# Patient Record
Sex: Male | Born: 1960 | Race: White | Hispanic: No | Marital: Married | Smoking: Never smoker
Health system: Southern US, Community
[De-identification: ages and names within clinical notes are randomized; demographics above are authoritative.]

## PROBLEM LIST (undated history)

## (undated) DIAGNOSIS — R350 Frequency of micturition: Secondary | ICD-10-CM

## (undated) HISTORY — PX: VASECTOMY REVERSAL: SHX243

## (undated) HISTORY — PX: VASECTOMY: SHX75

---

## 2019-04-22 DIAGNOSIS — U071 COVID-19: Secondary | ICD-10-CM

## 2019-04-23 ENCOUNTER — Encounter (HOSPITAL_COMMUNITY): Payer: Self-pay | Admitting: Internal Medicine

## 2019-04-23 ENCOUNTER — Other Ambulatory Visit: Payer: Self-pay

## 2019-04-23 ENCOUNTER — Inpatient Hospital Stay (HOSPITAL_COMMUNITY)
Admission: AD | Admit: 2019-04-23 | Discharge: 2019-04-27 | DRG: 177 | Disposition: A | Discharge status: Home / Self Care | Payer: BC Managed Care – PPO | Source: Other Acute Inpatient Hospital | Attending: Family Medicine | Admitting: Family Medicine

## 2019-04-23 DIAGNOSIS — J1282 Pneumonia due to coronavirus disease 2019: Secondary | ICD-10-CM | POA: Diagnosis present

## 2019-04-23 DIAGNOSIS — J9601 Acute respiratory failure with hypoxia: Secondary | ICD-10-CM | POA: Diagnosis present

## 2019-04-23 DIAGNOSIS — E041 Nontoxic single thyroid nodule: Secondary | ICD-10-CM | POA: Diagnosis present

## 2019-04-23 DIAGNOSIS — R001 Bradycardia, unspecified: Secondary | ICD-10-CM | POA: Diagnosis present

## 2019-04-23 DIAGNOSIS — U071 COVID-19: Secondary | ICD-10-CM | POA: Diagnosis present

## 2019-04-23 DIAGNOSIS — R7989 Other specified abnormal findings of blood chemistry: Secondary | ICD-10-CM | POA: Diagnosis not present

## 2019-04-23 DIAGNOSIS — R0902 Hypoxemia: Secondary | ICD-10-CM

## 2019-04-23 HISTORY — DX: Frequency of micturition: R35.0

## 2019-04-23 LAB — CBC
HCT: 40.9 % (ref 39.0–52.0)
Hemoglobin: 13.9 g/dL (ref 13.0–17.0)
MCH: 31 pg (ref 26.0–34.0)
MCHC: 34 g/dL (ref 30.0–36.0)
MCV: 91.1 fL (ref 80.0–100.0)
Platelets: 266 10*3/uL (ref 150–400)
RBC: 4.49 MIL/uL (ref 4.22–5.81)
RDW: 13 % (ref 11.5–15.5)
WBC: 7.7 10*3/uL (ref 4.0–10.5)
nRBC: 0 % (ref 0.0–0.2)

## 2019-04-23 LAB — HIV ANTIBODY (ROUTINE TESTING W REFLEX): HIV Screen 4th Generation wRfx: NONREACTIVE

## 2019-04-23 LAB — CREATININE, SERUM
Creatinine, Ser: 0.86 mg/dL (ref 0.61–1.24)
GFR calc Af Amer: >60 mL/min (ref >60)
GFR calc non Af Amer: >60 mL/min (ref >60)

## 2019-04-23 LAB — HEMOGLOBIN A1C
Hgb A1c MFr Bld: 5.4 % (ref 4.8–5.6)
Mean Plasma Glucose: 108.28 mg/dL

## 2019-04-23 LAB — GLUCOSE, CAPILLARY
Glucose-Capillary: 119 mg/dL — ABNORMAL HIGH (ref 70–99)
Glucose-Capillary: 138 mg/dL — ABNORMAL HIGH (ref 70–99)

## 2019-04-23 LAB — ABO/RH: ABO/RH(D): A POS

## 2019-04-23 MED ORDER — SODIUM CHLORIDE 0.9 % IV SOLN
100.0000 mg | Freq: Every day | INTRAVENOUS | Status: AC
  Administered 2019-04-23 – 2019-04-26 (×4): 100 mg via INTRAVENOUS
  Filled 2019-04-23 (×5): qty 20

## 2019-04-23 MED ORDER — INSULIN ASPART 100 UNIT/ML ~~LOC~~ SOLN: 0.0000 [IU] | Freq: Every day | SUBCUTANEOUS | Status: DC

## 2019-04-23 MED ORDER — ZINC SULFATE 220 (50 ZN) MG PO CAPS
220.0000 mg | ORAL_CAPSULE | Freq: Every day | ORAL | Status: DC
  Administered 2019-04-23 – 2019-04-27 (×5): 220 mg via ORAL
  Filled 2019-04-23 (×5): qty 1

## 2019-04-23 MED ORDER — ENOXAPARIN SODIUM 40 MG/0.4ML ~~LOC~~ SOLN
40.0000 mg | SUBCUTANEOUS | Status: DC
  Administered 2019-04-23 – 2019-04-27 (×4): 40 mg via SUBCUTANEOUS
  Filled 2019-04-23 (×4): qty 0.4

## 2019-04-23 MED ORDER — DEXAMETHASONE SODIUM PHOSPHATE 10 MG/ML IJ SOLN
6.0000 mg | INTRAMUSCULAR | Status: DC
  Administered 2019-04-24: 6 mg via INTRAVENOUS
  Filled 2019-04-23: qty 1

## 2019-04-23 MED ORDER — IPRATROPIUM-ALBUTEROL 20-100 MCG/ACT IN AERS
1.0000 | INHALATION_SPRAY | Freq: Four times a day (QID) | RESPIRATORY_TRACT | Status: DC
  Administered 2019-04-23 – 2019-04-27 (×14): 1 via RESPIRATORY_TRACT
  Filled 2019-04-23: qty 4

## 2019-04-23 MED ORDER — PANTOPRAZOLE SODIUM 40 MG PO TBEC
40.0000 mg | DELAYED_RELEASE_TABLET | Freq: Every day | ORAL | Status: DC
  Administered 2019-04-23 – 2019-04-27 (×5): 40 mg via ORAL
  Filled 2019-04-23 (×5): qty 1

## 2019-04-23 MED ORDER — ONDANSETRON HCL 4 MG/2ML IJ SOLN: 4.0000 mg | Freq: Four times a day (QID) | INTRAMUSCULAR | Status: DC | PRN

## 2019-04-23 MED ORDER — INSULIN ASPART 100 UNIT/ML ~~LOC~~ SOLN: 0.0000 [IU] | Freq: Three times a day (TID) | SUBCUTANEOUS | Status: DC

## 2019-04-23 MED ORDER — ASCORBIC ACID 500 MG PO TABS
500.0000 mg | ORAL_TABLET | Freq: Every day | ORAL | Status: DC
  Administered 2019-04-23 – 2019-04-27 (×5): 500 mg via ORAL
  Filled 2019-04-23 (×5): qty 1

## 2019-04-23 MED ORDER — ACETAMINOPHEN 325 MG PO TABS: 650.0000 mg | ORAL_TABLET | Freq: Four times a day (QID) | ORAL | Status: DC | PRN

## 2019-04-23 MED ORDER — ONDANSETRON HCL 4 MG PO TABS: 4.0000 mg | ORAL_TABLET | Freq: Four times a day (QID) | ORAL | Status: DC | PRN

## 2019-04-23 NOTE — H&P (Addendum)
History and Physical    Greydon Betke  ACZ:[id number]  DOB: [birth year]  DOA: 04/23/2019 PCP: Lise Auer, MD   Patient coming from: [facility]- from home  Chief Complaint: COVID 19 pneumonia  HPI: Parchment Shellhammer is a 59 y.o. male with medical history of recent infection with COVID-19 who was admitted to [facility] on 2/22.  Pulse ox was initially 80% on room air and CRP greater than 150.  Chest x-ray revealed patchy bilateral basilar pneumonia.  A CTA was negative for PE but revealed bilateral groundglass opacities and a basilar predominant distribution consistent with COVID-19 pneumonia.  The patient was initially started on 2 L of oxygen.  He was also given IV Decadron, Remdesivir and Actemra. He was initially given Ceftriaxone and Azithromycin as well but these were discontinued.  The patient's oxygen requirements have increased and he was increased steadily to 6 L of nasal cannula.  It was then decided that the patient be transferred to [facility].  Upon arrival, I have evaluated him. He has a mild cough with very little sputum. He has no dyspnea, nausea, vomiting or diarrhea. His appetite is good and he is urinating well. He has no discomfort currently.     Review of Systems:  All other systems reviewed and apart from HPI, are negative.  Past medical history  He has no medical issues  Social History:  He does not use tobacco, alcohol or drugs. He works as a IT sales professional in [employer]   Surgical history- Vasectomy with reversal of vasectomy  Family history: HTN   Prior to Admission medications   Not on File    Physical Exam: Wt Readings from Last 3 Encounters:  04/23/19 83.4 kg   Vitals:   04/23/19 1430 04/23/19 1457 04/23/19 1458  BP: 116/73    Pulse: 62    Temp: 98.2 F (36.8 C) 98.2 F (36.8 C)   TempSrc: Oral Oral   SpO2: 91%    Weight:   83.4 kg  Height:   5\' 11"  (1.803 m)      Constitutional:  Calm & comfortable Eyes: PERRLA, lids  and conjunctivae normal ENT:  Mucous membranes are moist.  Pharynx clear of exudate   Normal dentition.  Neck: Supple, no masses  Respiratory:  Crackles a b/l bases of lungs Normal respiratory effort.  Cardiovascular:  S1 & S2 heard, regular rate and rhythm No Murmurs Abdomen:  Non distended No tenderness, No masses Bowel sounds normal Extremities:  No clubbing / cyanosis No pedal edema No joint deformity    Skin:  No rashes, lesions or ulcers Neurologic:  AAO x 3 CN 2-12 grossly intact Sensation intact Strength 5/5 in all 4 extremities Psychiatric:  Normal Mood and affect    Labs on Admission: I have personally reviewed labs and imaging studies from [place]    Assessment/Plan Principal Problem:   Pneumonia due to COVID-19 virus   Acute respiratory failure with hypoxemia (HCC) - He tested positive for COVID 19 on 2/14- he believes he contracted it at work - he is currently on 6 L O2, has b/l crackles at lung bases but no respiratory distress - no GI symptoms - cont Decadron and Remdesivir - add Combivent and Protonix - cont incentive spirometry, Vit C and Zinc - daily labs ordered  Left thyroid nodule  - incidental finding on CT - outpatient work up   DVT prophylaxis: Lovenox Code Status: Full code  Family Communication: Melody, wife  Disposition Plan: home once improved  Consults called: none  Admission status: inpatient    Debbe Odea MD [facility] Pager: [email or web address] Password [email or web address] 7PM-7AM, please contact night-coverage   04/23/2019, 3:15 PM

## 2019-04-23 NOTE — Progress Notes (Signed)
Patient arrived from [facility] via stretcher. Patient A/Ox4, independant, SpO2 91% 5L nasal cannula. Patient has no complaints at this time.   Assessment and admission completed. MD rounded on patient and orders placed. Plan of care discussed with patient. IS/flutter valve bedside and patient educated on use. Patient requesting cheeseburger;cafeteria to bring pt one for dinner.   Call light in reach. Will continue to monitor.  1525: Patient ambulate to bathroom; steady gait. Back to bed.

## 2019-04-23 NOTE — Plan of Care (Signed)
  Problem: Respiratory: Goal: Will maintain a patent airway Outcome: Progressing   Problem: Education: Goal: Knowledge of risk factors and measures for prevention of condition will improve Outcome: Progressing   

## 2019-04-24 DIAGNOSIS — U071 COVID-19: Principal | ICD-10-CM

## 2019-04-24 DIAGNOSIS — J1282 Pneumonia due to coronavirus disease 2019: Secondary | ICD-10-CM

## 2019-04-24 LAB — CBC WITH DIFFERENTIAL/PLATELET
Abs Immature Granulocytes: 0.06 10*3/uL (ref 0.00–0.07)
Basophils Absolute: 0 10*3/uL (ref 0.0–0.1)
Basophils Relative: 0 %
Eosinophils Absolute: 0 10*3/uL (ref 0.0–0.5)
Eosinophils Relative: 0 %
HCT: 40.7 % (ref 39.0–52.0)
Hemoglobin: 13.4 g/dL (ref 13.0–17.0)
Immature Granulocytes: 1 %
Lymphocytes Relative: 7 %
Lymphs Abs: 0.6 10*3/uL — ABNORMAL LOW (ref 0.7–4.0)
MCH: 30.2 pg (ref 26.0–34.0)
MCHC: 32.9 g/dL (ref 30.0–36.0)
MCV: 91.7 fL (ref 80.0–100.0)
Monocytes Absolute: 0.6 10*3/uL (ref 0.1–1.0)
Monocytes Relative: 7 %
Neutro Abs: 7.5 10*3/uL (ref 1.7–7.7)
Neutrophils Relative %: 85 %
Platelets: 309 10*3/uL (ref 150–400)
RBC: 4.44 MIL/uL (ref 4.22–5.81)
RDW: 13 % (ref 11.5–15.5)
WBC: 8.9 10*3/uL (ref 4.0–10.5)
nRBC: 0 % (ref 0.0–0.2)

## 2019-04-24 LAB — COMPREHENSIVE METABOLIC PANEL
ALT: 39 U/L (ref 0–44)
AST: 41 U/L (ref 15–41)
Albumin: 2.7 g/dL — ABNORMAL LOW (ref 3.5–5.0)
Alkaline Phosphatase: 43 U/L (ref 38–126)
Anion gap: 7 (ref 5–15)
BUN: 28 mg/dL — ABNORMAL HIGH (ref 6–20)
CO2: 26 mmol/L (ref 22–32)
Calcium: 8.3 mg/dL — ABNORMAL LOW (ref 8.9–10.3)
Chloride: 108 mmol/L (ref 98–111)
Creatinine, Ser: 0.88 mg/dL (ref 0.61–1.24)
GFR calc Af Amer: >60 mL/min (ref >60)
GFR calc non Af Amer: >60 mL/min (ref >60)
Glucose, Bld: 99 mg/dL (ref 70–99)
Potassium: 4.2 mmol/L (ref 3.5–5.1)
Sodium: 141 mmol/L (ref 135–145)
Total Bilirubin: 0.5 mg/dL (ref 0.3–1.2)
Total Protein: 6.1 g/dL — ABNORMAL LOW (ref 6.5–8.1)

## 2019-04-24 LAB — GLUCOSE, CAPILLARY
Glucose-Capillary: 97 mg/dL (ref 70–99)
Glucose-Capillary: 137 mg/dL — ABNORMAL HIGH (ref 70–99)
Glucose-Capillary: 136 mg/dL — ABNORMAL HIGH (ref 70–99)

## 2019-04-24 LAB — D-DIMER, QUANTITATIVE: D-Dimer, Quant: 0.8 ug/mL-FEU — ABNORMAL HIGH (ref 0.00–0.50)

## 2019-04-24 LAB — C-REACTIVE PROTEIN: CRP: 5.9 mg/dL — ABNORMAL HIGH (ref <1.0)

## 2019-04-24 LAB — FERRITIN: Ferritin: 864 ng/mL — ABNORMAL HIGH (ref 24–336)

## 2019-04-24 LAB — MAGNESIUM: Magnesium: 2.2 mg/dL (ref 1.7–2.4)

## 2019-04-24 MED ORDER — LIP MEDEX EX OINT
TOPICAL_OINTMENT | CUTANEOUS | Status: DC | PRN
  Filled 2019-04-24: qty 7

## 2019-04-24 MED ORDER — METHYLPREDNISOLONE SODIUM SUCC 40 MG IJ SOLR
40.0000 mg | Freq: Two times a day (BID) | INTRAMUSCULAR | Status: DC
  Administered 2019-04-24 – 2019-04-27 (×7): 40 mg via INTRAVENOUS
  Filled 2019-04-24 (×7): qty 1

## 2019-04-24 NOTE — Progress Notes (Signed)
PROGRESS NOTE    Thoren Hosang  UOH:[id number] DOB: [birth year] DOA: 04/23/2019 PCP: Mateo Flow, MD   Brief Narrative:  Daniel Jensen is [place] 59 y.o. male with medical history of recent infection with COVID-19 who was admitted to [facility] on 2/22.  Pulse ox was initially 80% on room air and CRP greater than 150.  Chest x-ray revealed patchy bilateral basilar pneumonia.  Amnah Breuer CTA was negative for PE but revealed bilateral groundglass opacities and Frantz Quattrone basilar predominant distribution consistent with COVID-19 pneumonia.  The patient was initially started on 2 L of oxygen.  He was also given IV Decadron, Remdesivir and Actemra. He was initially given Ceftriaxone and Azithromycin as well but these were discontinued.  The patient's oxygen requirements have increased and he was increased steadily to 6 L of nasal cannula.  It was then decided that the patient be transferred to [facility].   Assessment & Plan:   Principal Problem:   Pneumonia due to COVID-19 virus Active Problems:   Acute respiratory failure with hypoxemia (HCC)   Thyroid nodule  Pneumonia due to COVID-19 virus   Acute respiratory failure with hypoxemia ([place]) - He tested positive for COVID 19 on 2/14- he believes he contracted it at work - currently requiring 4 L [place], improving - cont steroids and Remdesivir - s/p actemra at [place]  - add Combivent and Protonix - cont incentive spirometry, Vit C and Zinc - daily labs ordered - I/O, daily weights - IS, OOB, prone as able  COVID-19 Labs  Recent Labs    04/24/19 0310  DDIMER 0.80*  FERRITIN 864*  CRP 5.9*    No results found for: SARSCOV2NAA   Left thyroid nodule  - incidental finding on CT - outpatient work up with thyroid US recommended  Sinus Bradycardia Asymptomatic, continue to monitor  DVT prophylaxis: lovenox Code Status: full Family Communication: none at bedside Disposition Plan:  . Patient came from: home           . Anticipated d/c  place: home . Barriers to d/c OR conditions which need to be met to effect [place] safe d/c: pending further improvement in oxygenation  Consultants:   none  Procedures:   OSH CT Scan IMPRESSION: 1. No pulmonary embolus. 2. Geographic bilateral ground-glass opacities in Carlissa Pesola basilar predominant distribution consistent with COVID-19 pneumonia. Parenchymal involvement is moderate to extensive. 3. Left thyroid nodule measuring 19 mm. Recommend thyroid US (ref: J Am Coll Radiol. 2015 Feb;12(2): 143-50). 4. Prominent right hilar node is likely reactive.   Electronically Signed   By: [name] M.D.   On: 04/22/2019 19:02  Antimicrobials:  Anti-infectives (From admission, onward)   Start     Dose/Rate Route Frequency Ordered Stop   04/23/19 1800  remdesivir 100 mg in sodium chloride 0.9 % 100 mL IVPB     100 mg 200 mL/hr over 30 Minutes Intravenous Daily 04/23/19 1625 04/27/19 0959     Subjective: No new complaints Had hard time sleeping last night  Objective: Vitals:   04/23/19 1954 04/24/19 0440 04/24/19 0441 04/24/19 0759  BP: (!) 103/59 116/67  115/77  Pulse: 65 (!) 49 (!) 50 (!) 51  Resp:  15 13 17   Temp: (!) 97.2 F (36.2 C) 98.1 F (36.7 C)  98 F (36.7 C)  TempSrc: Oral Oral  Oral  SpO2: 94% 95% 96% 93%  Weight:      Height:        Intake/Output Summary (Last 24 hours) at  04/24/2019 1050 Last data filed at 04/24/2019 0300 Gross per 24 hour  Intake 340 ml  Output 300 ml  Net 40 ml   Filed Weights   04/23/19 1458  Weight: 83.4 kg    Examination:  General exam: Appears calm and comfortable  Respiratory system: Clear to auscultation. Respiratory effort normal. Cardiovascular system: S1 & S2 heard, RRR. Gastrointestinal system: Abdomen is nondistended, soft and nontender. Central nervous system: Alert and oriented. No focal neurological deficits. Extremities: no LEE Skin: No rashes, lesions or ulcers Psychiatry: Judgement and insight appear normal.  Mood & affect appropriate.     Data Reviewed: I have personally reviewed following labs and imaging studies  CBC: Recent Labs  Lab 04/23/19 1630 04/24/19 0310  WBC 7.7 8.9  NEUTROABS  --  7.5  HGB 13.9 13.4  HCT 40.9 40.7  MCV 91.1 91.7  PLT 266 161   Basic Metabolic Panel: Recent Labs  Lab 04/23/19 1630 04/24/19 0310  NA  --  141  K  --  4.2  CL  --  108  CO2  --  26  GLUCOSE  --  99  BUN  --  28*  CREATININE 0.86 0.88  CALCIUM  --  8.3*  MG  --  2.2   GFR: Estimated Creatinine Clearance: 97.5 mL/min (by C-G formula based on SCr of 0.88 mg/dL). Liver Function Tests: Recent Labs  Lab 04/24/19 0310  AST 41  ALT 39  ALKPHOS 43  BILITOT 0.5  PROT 6.1*  ALBUMIN 2.7*   No results for input(s): LIPASE, AMYLASE in the last 168 hours. No results for input(s): AMMONIA in the last 168 hours. Coagulation Profile: No results for input(s): INR, PROTIME in the last 168 hours. Cardiac Enzymes: No results for input(s): CKTOTAL, CKMB, CKMBINDEX, TROPONINI in the last 168 hours. BNP (last 3 results) No results for input(s): PROBNP in the last 8760 hours. HbA1C: Recent Labs    04/23/19 1630  HGBA1C 5.4   CBG: Recent Labs  Lab 04/23/19 1633 04/23/19 2002 04/24/19 0756  GLUCAP 119* 138* 97   Lipid Profile: No results for input(s): CHOL, HDL, LDLCALC, TRIG, CHOLHDL, LDLDIRECT in the last 72 hours. Thyroid Function Tests: No results for input(s): TSH, T4TOTAL, FREET4, T3FREE, THYROIDAB in the last 72 hours. Anemia Panel: Recent Labs    04/24/19 0310  FERRITIN 864*   Sepsis Labs: No results for input(s): PROCALCITON, LATICACIDVEN in the last 168 hours.  No results found for this or any previous visit (from the past 240 hour(s)).       Radiology Studies: No results found.      Scheduled Meds: . vitamin C  500 mg Oral Daily  . enoxaparin (LOVENOX) injection  40 mg Subcutaneous Q24H  . insulin aspart  0-5 Units Subcutaneous QHS  . insulin aspart   0-9 Units Subcutaneous TID WC  . Ipratropium-Albuterol  1 puff Inhalation QID  . methylPREDNISolone (SOLU-MEDROL) injection  40 mg Intravenous Q12H  . pantoprazole  40 mg Oral Daily  . zinc sulfate  220 mg Oral Daily   Continuous Infusions: . remdesivir 100 mg in NS 100 mL 100 mg (04/24/19 0920)     LOS: 1 day    Time spent: over 30 min    Fayrene Helper, MD [facility]   To contact the attending provider between 7A-7P or the covering provider during after hours 7P-7A, please log into the web site [email or web address] and access using universal [place] password for that web site. If you  do not have the password, please call the hospital operator.  04/24/2019, 10:50 AM

## 2019-04-24 NOTE — Progress Notes (Signed)
PHYSICAL THERAPY EVALUATION  HISTORY OF CURRENT ILLNESS:  CLINICAL IMPRESSION: Pt admitted with above hx and above dx, pt was very independent prior to admission and living home with family. This pm pt is still quite independent and moving very well, at rest he was on 5L/min and sats in low 90s, with ambulation, which he ambulates very fast pt desat to 80% on 6L/min via [place]. With cues able to recover to 91% within of rest with pursed lip breathing. Pt has been educated on importance of breathing exercises and also being mindful of breathing when ambulating. Pt will benefit from continued PT tx while in hospital to address deficits in activity tolerance. At d/c he should be able to return home w/o further follow up.  D/C RECOMMENDATION:  home with no f/u     04/24/19 1300  PT Visit Information  Assistance Needed +1  History of Present Illness 59 y.o. male with medical history of recent infection with COVID-19 who was admitted to [facility] on 2/22.  Pulse ox was initially 80% on room air and CRP greater than 150. He has a mild cough with very little sputum. He has no dyspnea, nausea, vomiting or diarrhea.   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  Communication  Communication No difficulties  Pain Assessment  Pain Assessment No/denies pain  Cognition  Arousal/Alertness Awake/alert  Behavior During Therapy WFL for tasks  assessed/performed  Overall Cognitive Status Within Functional Limits for tasks assessed  Upper Extremity Assessment  Upper Extremity Assessment Generalized weakness  Bed Mobility  Overal bed mobility Needs Assistance  General bed mobility comments Up in chair - Likely mod I with bed mob  Transfers  Overall transfer level Needs assistance  Equipment used None  Transfers Sit to/from Stand  Sit to Stand Supervision  Balance  Overall balance assessment No apparent balance deficits (not formally assessed)  Exercises  Exercises Other exercises  Other Exercises  Other Exercises x5 incentive spirometer  Other Exercises x5 flutter valve  Other Exercises pursed lip breathing  Other Exercises walk 400 ft  Acute Rehab PT Goals  Patient Stated Goal Go back to family   Drema Pry, PT

## 2019-04-24 NOTE — Progress Notes (Signed)
Occupational Therapy Evaluation Patient Details Name: Daniel Jensen MRN: [id number] DOB: [birth year] Today's Date: 04/24/2019    History of Present Illness 59 y.o. male with medical history of recent infection with COVID-19 who was admitted to [facility] on 2/22.  Pulse ox was initially 80% on room air and CRP greater than 150. He has a mild cough with very little sputum. He has no dyspnea, nausea, vomiting or diarrhea.    Clinical Impression   Patient is kind and lives at home with wife and children.  He is independent at prior level and is very active, walking a lot at his job.  Today patient was on 5L O2 at rest and SpO2 at 95.  With mobility on 6L SpO2 88, but no symptoms.  He was able to walk the unit twice with supervision and complete standing grooming and toileting.  Patient would benefit from OT to increase activity tolerance and energy conservation skills to improve independence with ADLs, as tolerance is not baseline and he is still requiring increased amount of O2.  Reviewed breathing exercises, able to pull 2500.  Will continue to follow with OT acutely to address the deficits listed below.      Follow Up Recommendations  No OT follow up    Equipment Recommendations  None recommended by OT    Recommendations for Other Services       Precautions / Restrictions Precautions Precautions: Fall Restrictions Weight Bearing Restrictions: No      Mobility Bed Mobility Overal bed mobility: Needs Assistance             General bed mobility comments: Up in chair - Likely mod I with bed mob  Transfers Overall transfer level: Needs assistance Equipment used: None Transfers: Sit to/from Stand Sit to Stand: Supervision              Balance Overall balance assessment: No apparent balance deficits (not formally assessed)                                         ADL either performed or assessed with clinical judgement   ADL Overall ADL's :  Needs assistance/impaired Eating/Feeding: Independent;Sitting   Grooming: Supervision/safety;Standing;Wash/dry hands;Oral care   Upper Body Bathing: Supervision/ safety;Standing   Lower Body Bathing: Supervison/ safety;Sit to/from stand   Upper Body Dressing : Supervision/safety;Standing   Lower Body Dressing: Supervision/safety;Sit to/from stand   Toilet Transfer: Sales executive;Ambulation   Toileting- Clothing Manipulation and Hygiene: Supervision/safety;Sit to/from stand       Functional mobility during ADLs: Supervision/safety       Vision         Perception     Praxis      Pertinent Vitals/Pain Pain Assessment: No/denies pain     Hand Dominance     Extremity/Trunk Assessment Upper Extremity Assessment Upper Extremity Assessment: Generalized weakness           Communication Communication Communication: No difficulties   Cognition Arousal/Alertness: Awake/alert Behavior During Therapy: WFL for tasks assessed/performed Overall Cognitive Status: Within Functional Limits for tasks assessed                                     General Comments       Exercises Exercises: Other exercises Other Exercises Other Exercises: x5 incentive spirometer Other Exercises: x5 flutter  valve Other Exercises: pursed lip breathing Other Exercises: walk 400 ft   Shoulder Instructions      Home Living Family/patient expects to be discharged to:: Private residence Living Arrangements: Children;Spouse/significant other Available Help at Discharge: Family Type of Home: House Home Access: Stairs to enter     Home Layout: Two level;Able to live on main level with bedroom/bathroom     Bathroom Shower/Tub: Producer, television/film/video: Standard Bathroom Accessibility: Yes   Home Equipment: None          Prior Functioning/Environment Level of Independence: Independent        Comments: 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        OT Problem List: Decreased strength;Decreased activity tolerance;Cardiopulmonary status limiting activity      OT Treatment/Interventions: Self-care/ADL training;Therapeutic exercise;Energy conservation;Therapeutic activities;Patient/family education    OT Goals(Current goals can be found in the care plan section) Acute Rehab OT Goals Patient Stated Goal: Go back to family OT Goal Formulation: With patient Time For Goal Achievement: 05/08/19 Potential to Achieve Goals: Good  OT Frequency: Min 3X/week   Barriers to D/C:            Co-evaluation              AM-PAC OT "6 Clicks" Daily Activity     Outcome Measure Help from another person eating meals?: None Help from another person taking care of personal grooming?: A Little Help from another person toileting, which includes using toliet, bedpan, or urinal?: A Little Help from another person bathing (including washing, rinsing, drying)?: A Little Help from another person to put on and taking off regular upper body clothing?: A Little Help from another person to put on and taking off regular lower body clothing?: A Little 6 Click Score: 19   End of Session Equipment Utilized During Treatment: Gait belt;Oxygen Nurse Communication: Mobility status  Activity Tolerance: Patient tolerated treatment well Patient left: in chair;with call bell/phone within reach  OT Visit Diagnosis: Muscle weakness (generalized) (M62.81)                Time: 0867-6195 OT Time Calculation (min): 31 min Charges:  OT General Charges $OT Visit: 1 Visit OT Evaluation $OT Eval Moderate Complexity: 1 Mod OT Treatments $Self Care/Home Management : 8-22 mins  [name], OTR/L   Daniel Jensen 04/24/2019, 1:36 PM

## 2019-04-25 ENCOUNTER — Inpatient Hospital Stay (HOSPITAL_COMMUNITY): Payer: BC Managed Care – PPO

## 2019-04-25 DIAGNOSIS — R7989 Other specified abnormal findings of blood chemistry: Secondary | ICD-10-CM

## 2019-04-25 LAB — COMPREHENSIVE METABOLIC PANEL
ALT: 40 U/L (ref 0–44)
AST: 41 U/L (ref 15–41)
Albumin: 2.7 g/dL — ABNORMAL LOW (ref 3.5–5.0)
Alkaline Phosphatase: 41 U/L (ref 38–126)
Anion gap: 10 (ref 5–15)
BUN: 25 mg/dL — ABNORMAL HIGH (ref 6–20)
CO2: 23 mmol/L (ref 22–32)
Calcium: 8.3 mg/dL — ABNORMAL LOW (ref 8.9–10.3)
Chloride: 107 mmol/L (ref 98–111)
Creatinine, Ser: 0.78 mg/dL (ref 0.61–1.24)
GFR calc Af Amer: >60 mL/min (ref >60)
GFR calc non Af Amer: >60 mL/min (ref >60)
Glucose, Bld: 138 mg/dL — ABNORMAL HIGH (ref 70–99)
Potassium: 4.3 mmol/L (ref 3.5–5.1)
Sodium: 140 mmol/L (ref 135–145)
Total Bilirubin: 0.8 mg/dL (ref 0.3–1.2)
Total Protein: 6 g/dL — ABNORMAL LOW (ref 6.5–8.1)

## 2019-04-25 LAB — CBC WITH DIFFERENTIAL/PLATELET
Abs Immature Granulocytes: 0.04 10*3/uL (ref 0.00–0.07)
Basophils Absolute: 0 10*3/uL (ref 0.0–0.1)
Basophils Relative: 0 %
Eosinophils Absolute: 0 10*3/uL (ref 0.0–0.5)
Eosinophils Relative: 0 %
HCT: 39.8 % (ref 39.0–52.0)
Hemoglobin: 13.3 g/dL (ref 13.0–17.0)
Immature Granulocytes: 1 %
Lymphocytes Relative: 5 %
Lymphs Abs: 0.4 10*3/uL — ABNORMAL LOW (ref 0.7–4.0)
MCH: 30.3 pg (ref 26.0–34.0)
MCHC: 33.4 g/dL (ref 30.0–36.0)
MCV: 90.7 fL (ref 80.0–100.0)
Monocytes Absolute: 0.3 10*3/uL (ref 0.1–1.0)
Monocytes Relative: 3 %
Neutro Abs: 7.4 10*3/uL (ref 1.7–7.7)
Neutrophils Relative %: 91 %
Platelets: 328 10*3/uL (ref 150–400)
RBC: 4.39 MIL/uL (ref 4.22–5.81)
RDW: 12.7 % (ref 11.5–15.5)
WBC: 8.1 10*3/uL (ref 4.0–10.5)
nRBC: 0 % (ref 0.0–0.2)

## 2019-04-25 LAB — FERRITIN: Ferritin: 714 ng/mL — ABNORMAL HIGH (ref 24–336)

## 2019-04-25 LAB — MAGNESIUM: Magnesium: 2.1 mg/dL (ref 1.7–2.4)

## 2019-04-25 LAB — C-REACTIVE PROTEIN: CRP: 3 mg/dL — ABNORMAL HIGH (ref <1.0)

## 2019-04-25 LAB — D-DIMER, QUANTITATIVE: D-Dimer, Quant: 1.45 ug/mL-FEU — ABNORMAL HIGH (ref 0.00–0.50)

## 2019-04-25 LAB — GLUCOSE, CAPILLARY
Glucose-Capillary: 148 mg/dL — ABNORMAL HIGH (ref 70–99)
Glucose-Capillary: 112 mg/dL — ABNORMAL HIGH (ref 70–99)

## 2019-04-25 NOTE — Progress Notes (Signed)
Pt had BM this shift without difficulty.

## 2019-04-25 NOTE — Progress Notes (Addendum)
Occupational Therapy Treatment Patient Details Name: Daniel Jensen MRN: [id number] DOB: 10/13/1960 Today's Date: 04/25/2019    History of present illness 59 y.o. male with medical history of recent infection with COVID-19 who was admitted to [facility] on 2/22.  Pulse ox was initially 80% on room air and CRP greater than 150. He has a mild cough with very little sputum. He has no dyspnea, nausea, vomiting or diarrhea.    OT comments  Patient very eager for therapy and tired of sitting around.  He was independent with all mobility and standing ADLs today.  He walked the entire unit 3 times independently (assist just for lines).  Patient does need reminders to perform pursed lip breathing and take some rest breaks, despite having no shortness of breath.  He was on 4L O2 at rest with SpO2 98.  With mobility he dropped to 92, once bumped to 6L he maintained 88 with mobility.  Educated on UE theraband strengthening exercises and practiced.  Patient still needing to improve breathing techniques and activity tolerance, as evidenced by high O2 need. Will continue to follow with OT acutely to address the deficits listed below.    Follow Up Recommendations  No OT follow up    Equipment Recommendations  None recommended by OT    Recommendations for Other Services      Precautions / Restrictions Precautions Precautions: Fall Restrictions Weight Bearing Restrictions: No       Mobility Bed Mobility Overal bed mobility: Independent                Transfers Overall transfer level: Independent   Transfers: Sit to/from [employer] to Stand: Independent Stand pivot transfers: Independent            Balance Overall balance assessment: No apparent balance deficits (not formally assessed)                                         ADL either performed or assessed with clinical judgement   ADL Overall ADL's : Independent     Grooming:  Independent                               Functional mobility during ADLs: Independent       Vision       Perception     Praxis      Cognition Arousal/Alertness: Awake/alert Behavior During Therapy: WFL for tasks assessed/performed Overall Cognitive Status: Within Functional Limits for tasks assessed                                          Exercises Exercises: Other exercises;General Upper Extremity General Exercises - Upper Extremity Shoulder Flexion: AROM;Strengthening;Both;15 reps;Theraband Theraband Level (Shoulder Flexion): Level 2 (Red) Shoulder ABduction: AROM;Strengthening;Both;15 reps;Theraband Theraband Level (Shoulder Abduction): Level 2 (Red) Shoulder Horizontal ABduction: AROM;Both;Strengthening;15 reps;Theraband Theraband Level (Shoulder Horizontal Abduction): Level 2 (Red) Elbow Flexion: AROM;Strengthening;Both;15 reps;Theraband Theraband Level (Elbow Flexion): Level 2 (Red) Elbow Extension: AROM;Strengthening;15 reps;Both;Theraband Other Exercises Other Exercises: x5 incentive spirometer Other Exercises: x5 flutter valve Other Exercises: pursed lip breathing Other Exercises: Walk 1000 ft   Shoulder Instructions       General Comments      Pertinent Vitals/ Pain  Pain Assessment: Faces Faces Pain Scale: Hurts a little bit Pain Location: back - "tightness from sitting around" Pain Descriptors / Indicators: Discomfort;Sore;Tightness Pain Intervention(s): Monitored during session  Home Living                                          Prior Functioning/Environment              Frequency  Min 3X/week        Progress Toward Goals  OT Goals(current goals can now be found in the care plan section)  Progress towards OT goals: Progressing toward goals  Acute Rehab OT Goals Patient Stated Goal: Go back to family OT Goal Formulation: With patient Time For Goal Achievement:  05/08/19 Potential to Achieve Goals: Good  Plan Discharge plan remains appropriate    Co-evaluation                 AM-PAC OT "6 Clicks" Daily Activity     Outcome Measure   Help from another person eating meals?: None Help from another person taking care of personal grooming?: None Help from another person toileting, which includes using toliet, bedpan, or urinal?: None Help from another person bathing (including washing, rinsing, drying)?: None Help from another person to put on and taking off regular upper body clothing?: None Help from another person to put on and taking off regular lower body clothing?: None 6 Click Score: 24    End of Session Equipment Utilized During Treatment: Oxygen  OT Visit Diagnosis: Muscle weakness (generalized) (M62.81)   Activity Tolerance Patient tolerated treatment well   Patient Left in chair;with call bell/phone within reach   Nurse Communication Mobility status        Time: 8675-4492 OT Time Calculation (min): 35 min  Charges: OT General Charges $OT Visit: 1 Visit OT Treatments $Self Care/Home Management : 8-22 mins $Therapeutic Activity: 8-22 mins  [name], OTR/L    [name] 04/25/2019, 4:06 PM

## 2019-04-25 NOTE — Progress Notes (Addendum)
PROGRESS NOTE    Daniel Jensen  SUO:[id number] DOB: Mar 18, 1960 DOA: 04/23/2019 PCP: Mateo Flow, MD   Brief Narrative:  Daniel Jensen is Daniel Jensen 59 y.o. male with medical history of recent infection with COVID-19 who was admitted to [facility] on 2/22.  Pulse ox was initially 80% on room air and CRP greater than 150.  Chest x-ray revealed patchy bilateral basilar pneumonia.  Daniel Jensen CTA was negative for PE but revealed bilateral groundglass opacities and Daniel Jensen basilar predominant distribution consistent with COVID-19 pneumonia.  The patient was initially started on 2 L of oxygen.  He was also given IV Decadron, Remdesivir and Actemra. He was initially given Ceftriaxone and Azithromycin as well but these were discontinued.  The patient's oxygen requirements have increased and he was increased steadily to 6 L of nasal cannula.  It was then decided that the patient be transferred to [facility].   Assessment & Plan:   Principal Problem:   Pneumonia due to COVID-19 virus Active Problems:   Acute respiratory failure with hypoxemia (HCC)   Thyroid nodule  Pneumonia due to COVID-19 virus   Acute respiratory failure with hypoxemia ([place]) - He tested positive for COVID 19 on 2/14- he believes he contracted it at work - currently requiring 5 L , fluctuating - wean as tolerated - cont steroids and Remdesivir (first dose at [place] on 2/22, 200 mg dose) - s/p actemra at [place]  - add Combivent and Protonix - cont incentive spirometry, Vit C and Zinc - daily labs ordered - I/O, daily weights - IS, OOB, prone as able - rising d dimer, mild, follow LE [place] - repeat CXR 2/26  COVID-19 Labs  Recent Labs    04/24/19 0310 04/25/19 0213  DDIMER 0.80* 1.45*  FERRITIN 864* 714*  CRP 5.9* 3.0*    No results found for: SARSCOV2NAA   Left thyroid nodule  - incidental finding on CT - outpatient work up with thyroid US recommended  Sinus Bradycardia Asymptomatic, continue to  monitor  DVT prophylaxis: lovenox Code Status: full Family Communication: none at bedside - wife 2/25 Disposition Plan:  . Patient came from: home           . Anticipated d/c place: home . Barriers to d/c OR conditions which need to be met to effect Daniel Jensen safe d/c: pending further improvement in oxygenation  Consultants:   none  Procedures:   OSH CT Scan IMPRESSION: 1. No pulmonary embolus. 2. Geographic bilateral ground-glass opacities in Daniel Jensen basilar predominant distribution consistent with COVID-19 pneumonia. Parenchymal involvement is moderate to extensive. 3. Left thyroid nodule measuring 19 mm. Recommend thyroid US (ref: J Am Coll Radiol. 2015 Feb;12(2): 143-50). 4. Prominent right hilar node is likely reactive.   Electronically Signed   By: Daniel Jensen M.D.   On: 04/22/2019 19:02  Antimicrobials:  Anti-infectives (From admission, onward)   Start     Dose/Rate Route Frequency Ordered Stop   04/23/19 1800  remdesivir 100 mg in sodium chloride 0.9 % 100 mL IVPB     100 mg 200 mL/hr over 30 Minutes Intravenous Daily 04/23/19 1625 04/27/19 0959     Subjective: Feeling better Asking about potential discharge (discussed not ready yet with hypoxia)  Objective: Vitals:   04/24/19 2000 04/25/19 0500 04/25/19 0518 04/25/19 0751  BP: 123/74   114/67  Pulse: 60   63  Resp:   18 20  Temp:  98 F (36.7 C) 98 F (36.7 C) 97.9 F (36.6 C)  TempSrc:  Oral Oral Oral  SpO2: 93%   92%  Weight:      Height:        Intake/Output Summary (Last 24 hours) at 04/25/2019 1031 Last data filed at 04/25/2019 0518 Gross per 24 hour  Intake --  Output 1400 ml  Net -1400 ml   Filed Weights   04/23/19 1458  Weight: 83.4 kg    Examination:  General: No acute distress. Cardiovascular: Heart sounds show Daniel Jensen regular rate, and rhythm. Lungs: Clear to auscultation bilaterally  Abdomen: Soft, nontender, nondistended  Neurological: Alert and oriented 3. Moves all extremities 4.  Cranial nerves II through XII grossly intact. Skin: Warm and dry. No rashes or lesions. Extremities: No clubbing or cyanosis. No edema.   Data Reviewed: I have personally reviewed following labs and imaging studies  CBC: Recent Labs  Lab 04/23/19 1630 04/24/19 0310 04/25/19 0213  WBC 7.7 8.9 8.1  NEUTROABS  --  7.5 7.4  HGB 13.9 13.4 13.3  HCT 40.9 40.7 39.8  MCV 91.1 91.7 90.7  PLT 266 309 697   Basic Metabolic Panel: Recent Labs  Lab 04/23/19 1630 04/24/19 0310 04/25/19 0213  NA  --  141 140  K  --  4.2 4.3  CL  --  108 107  CO2  --  26 23  GLUCOSE  --  99 138*  BUN  --  28* 25*  CREATININE 0.86 0.88 0.78  CALCIUM  --  8.3* 8.3*  MG  --  2.2 2.1   GFR: Estimated Creatinine Clearance: 107.2 mL/min (by C-G formula based on SCr of 0.78 mg/dL). Liver Function Tests: Recent Labs  Lab 04/24/19 0310 04/25/19 0213  AST 41 41  ALT 39 40  ALKPHOS 43 41  BILITOT 0.5 0.8  PROT 6.1* 6.0*  ALBUMIN 2.7* 2.7*   No results for input(s): LIPASE, AMYLASE in the last 168 hours. No results for input(s): AMMONIA in the last 168 hours. Coagulation Profile: No results for input(s): INR, PROTIME in the last 168 hours. Cardiac Enzymes: No results for input(s): CKTOTAL, CKMB, CKMBINDEX, TROPONINI in the last 168 hours. BNP (last 3 results) No results for input(s): PROBNP in the last 8760 hours. HbA1C: Recent Labs    04/23/19 1630  HGBA1C 5.4   CBG: Recent Labs  Lab 04/23/19 2002 04/24/19 0756 04/24/19 1116 04/24/19 1646 04/24/19 2155  GLUCAP 138* 97 137* 136* 148*   Lipid Profile: No results for input(s): CHOL, HDL, LDLCALC, TRIG, CHOLHDL, LDLDIRECT in the last 72 hours. Thyroid Function Tests: No results for input(s): TSH, T4TOTAL, FREET4, T3FREE, THYROIDAB in the last 72 hours. Anemia Panel: Recent Labs    04/24/19 0310 04/25/19 0213  FERRITIN 864* 714*   Sepsis Labs: No results for input(s): PROCALCITON, LATICACIDVEN in the last 168 hours.  No results  found for this or any previous visit (from the past 240 hour(s)).       Radiology Studies: No results found.      Scheduled Meds: . vitamin C  500 mg Oral Daily  . enoxaparin (LOVENOX) injection  40 mg Subcutaneous Q24H  . Ipratropium-Albuterol  1 puff Inhalation QID  . methylPREDNISolone (SOLU-MEDROL) injection  40 mg Intravenous Q12H  . pantoprazole  40 mg Oral Daily  . zinc sulfate  220 mg Oral Daily   Continuous Infusions: . remdesivir 100 mg in NS 100 mL 100 mg (04/25/19 0902)     LOS: 2 days    Time spent: over 30 min    Fayrene Helper,  MD [facility]   To contact the attending provider between 7A-7P or the covering provider during after hours 7P-7A, please log into the web site [email or web address] and access using universal [place] password for that web site. If you do not have the password, please call the hospital operator.  04/25/2019, 10:31 AM

## 2019-04-25 NOTE — Progress Notes (Signed)
PT Cancellation Note  Patient Details Name: Daniel Jensen MRN: [id number] DOB: [birth year]   Cancelled Treatment:    Reason Eval/Treat Not Completed: Patient declined, no reason specified(All he said was that "he just walked with OT and did not want to do anything else") Harriett Rush, PT # [phone] CGV cell  Ephriam Jenkins 04/25/2019, 4:19 PM

## 2019-04-25 NOTE — Progress Notes (Signed)
Lower extremity venous has been completed.   Preliminary results in CV Proc.   Blanch Media 04/25/2019 3:00 PM

## 2019-04-26 ENCOUNTER — Inpatient Hospital Stay (HOSPITAL_COMMUNITY): Payer: BC Managed Care – PPO

## 2019-04-26 LAB — COMPREHENSIVE METABOLIC PANEL
ALT: 65 U/L — ABNORMAL HIGH (ref 0–44)
AST: 59 U/L — ABNORMAL HIGH (ref 15–41)
Albumin: 2.8 g/dL — ABNORMAL LOW (ref 3.5–5.0)
Alkaline Phosphatase: 36 U/L — ABNORMAL LOW (ref 38–126)
Anion gap: 9 (ref 5–15)
BUN: 30 mg/dL — ABNORMAL HIGH (ref 6–20)
CO2: 24 mmol/L (ref 22–32)
Calcium: 8.3 mg/dL — ABNORMAL LOW (ref 8.9–10.3)
Chloride: 107 mmol/L (ref 98–111)
Creatinine, Ser: 0.82 mg/dL (ref 0.61–1.24)
GFR calc Af Amer: >60 mL/min (ref >60)
GFR calc non Af Amer: >60 mL/min (ref >60)
Glucose, Bld: 122 mg/dL — ABNORMAL HIGH (ref 70–99)
Potassium: 4.4 mmol/L (ref 3.5–5.1)
Sodium: 140 mmol/L (ref 135–145)
Total Bilirubin: 0.8 mg/dL (ref 0.3–1.2)
Total Protein: 5.9 g/dL — ABNORMAL LOW (ref 6.5–8.1)

## 2019-04-26 LAB — CBC WITH DIFFERENTIAL/PLATELET
Abs Immature Granulocytes: 0.06 10*3/uL (ref 0.00–0.07)
Basophils Absolute: 0 10*3/uL (ref 0.0–0.1)
Basophils Relative: 0 %
Eosinophils Absolute: 0 10*3/uL (ref 0.0–0.5)
Eosinophils Relative: 0 %
HCT: 39 % (ref 39.0–52.0)
Hemoglobin: 13.1 g/dL (ref 13.0–17.0)
Immature Granulocytes: 1 %
Lymphocytes Relative: 4 %
Lymphs Abs: 0.5 10*3/uL — ABNORMAL LOW (ref 0.7–4.0)
MCH: 30.4 pg (ref 26.0–34.0)
MCHC: 33.6 g/dL (ref 30.0–36.0)
MCV: 90.5 fL (ref 80.0–100.0)
Monocytes Absolute: 0.5 10*3/uL (ref 0.1–1.0)
Monocytes Relative: 5 %
Neutro Abs: 9.6 10*3/uL — ABNORMAL HIGH (ref 1.7–7.7)
Neutrophils Relative %: 90 %
Platelets: 367 10*3/uL (ref 150–400)
RBC: 4.31 MIL/uL (ref 4.22–5.81)
RDW: 12.6 % (ref 11.5–15.5)
WBC: 10.6 10*3/uL — ABNORMAL HIGH (ref 4.0–10.5)
nRBC: 0 % (ref 0.0–0.2)

## 2019-04-26 LAB — C-REACTIVE PROTEIN: CRP: 1.5 mg/dL — ABNORMAL HIGH (ref <1.0)

## 2019-04-26 LAB — GLUCOSE, CAPILLARY
Glucose-Capillary: 95 mg/dL (ref 70–99)
Glucose-Capillary: 91 mg/dL (ref 70–99)
Glucose-Capillary: 128 mg/dL — ABNORMAL HIGH (ref 70–99)
Glucose-Capillary: 123 mg/dL — ABNORMAL HIGH (ref 70–99)

## 2019-04-26 LAB — FERRITIN: Ferritin: 599 ng/mL — ABNORMAL HIGH (ref 24–336)

## 2019-04-26 LAB — D-DIMER, QUANTITATIVE: D-Dimer, Quant: 1.01 ug/mL-FEU — ABNORMAL HIGH (ref 0.00–0.50)

## 2019-04-26 LAB — MAGNESIUM: Magnesium: 2 mg/dL (ref 1.7–2.4)

## 2019-04-26 NOTE — Progress Notes (Signed)
Physical Therapy Treatment Patient Details Name: Daniel Jensen MRN: [id number] DOB: February 14, 1961 Today's Date: 04/26/2019    History of Present Illness 59 y.o. male with medical history of recent infection with COVID-19 who was admitted to [facility] on 2/22.  Pulse ox was initially 80% on room air and CRP greater than 150. He has a mild cough with very little sputum. He has no dyspnea, nausea, vomiting or diarrhea.     PT Comments    Pt did well with mobility, was able to tolerate ambulating greater distance with stand by to mod I. Pt was initially on 6L/min via [place] and was satting in high 90s at rest (he reported he was supposed to be on 3L/min) was reduced to 4L/min for session, with ambulation pt desat to min 84% needed approx to recover to 90%, completed sit<.stand x 10, flutter valve x 10 and incentive spirometer x 10 with cues. After all tasks attempted to return pt to 3L/min again but pt desat t low 80s, pt given cues for pursed lip breathing and at end of session pt returned to 5L/min to maintain sats in high 80s.     Follow Up Recommendations  No PT follow up     Equipment Recommendations  None recommended by PT    Recommendations for Other Services       Precautions / Restrictions Precautions Precautions: Other (comment) Precaution Comments: desats w/ activity, poor insight into deficits Restrictions Weight Bearing Restrictions: No    Mobility  Bed Mobility Overal bed mobility: Independent                Transfers Overall transfer level: Independent Equipment used: None                Ambulation/Gait Ambulation/Gait assistance: Supervision Gait Distance (Feet): 350 Feet Assistive device: None Gait Pattern/deviations: WFL(Within Functional Limits) Gait velocity: fair   General Gait Details: ambulated approx 344ft with stand by assist, pt cued to pace himself and slow down ambulation pace to be able to maintain sats in therapeutic  range   Stairs             Wheelchair Mobility    Modified Rankin (Stroke Patients Only)       Balance Overall balance assessment: No apparent balance deficits (not formally assessed)                                          Cognition Arousal/Alertness: Awake/alert Behavior During Therapy: WFL for tasks assessed/performed Overall Cognitive Status: Within Functional Limits for tasks assessed                                 General Comments: seems to be functional but has poor awereness of deficits      Exercises Other Exercises Other Exercises: sit<>stand x 10 from recliner Other Exercises: flutter valve x 10 Other Exercises: incentive spirometer x 10 pulls max    General Comments        Pertinent Vitals/Pain Pain Assessment: No/denies pain    Home Living                      Prior Function            PT Goals (current goals can now be found in the care plan section)  Acute Rehab PT Goals Patient Stated Goal: Go back to family PT Goal Formulation: With patient Time For Goal Achievement: 05/08/19 Potential to Achieve Goals: Good Progress towards PT goals: Progressing toward goals    Frequency    Min 4X/week      PT Plan Current plan remains appropriate    Co-evaluation              AM-PAC PT "6 Clicks" Mobility   Outcome Measure  Help needed turning from your back to your side while in a flat bed without using bedrails?: None Help needed moving from lying on your back to sitting on the side of a flat bed without using bedrails?: None Help needed moving to and from a bed to a chair (including a wheelchair)?: None Help needed standing up from a chair using your arms (e.g., wheelchair or bedside chair)?: None Help needed to walk in hospital room?: A Little Help needed climbing 3-5 steps with a railing? : A Little 6 Click Score: 22    End of Session Equipment Utilized During Treatment:  Oxygen Activity Tolerance: Patient limited by fatigue;Patient tolerated treatment well Patient left: in chair;with call bell/phone within reach   PT Visit Diagnosis: Other abnormalities of gait and mobility (R26.89)     Time: 2263-3354 PT Time Calculation (min) (ACUTE ONLY): 23 min  Charges:  $Gait Training: 8-22 mins $Therapeutic Exercise: 8-22 mins                     Horald Chestnut, PT    Delford Field 04/26/2019, 1:33 PM

## 2019-04-26 NOTE — Progress Notes (Signed)
Occupational Therapy Treatment Patient Details Name: Daniel Jensen MRN: [id number] DOB: [birth year] Today's Date: 04/26/2019    History of present illness 59 y.o. male with medical history of recent infection with COVID-19 who was admitted to [facility] on 2/22.  Pulse ox was initially 80% on room air and CRP greater than 150. He has a mild cough with very little sputum. He has no dyspnea, nausea, vomiting or diarrhea.    OT comments  Patient very eager for therapy still and still demonstrating poor insight into deficits.  He was independent with all mobility and standing ADLs today but requires some reminders to slow down and rest if needed.  He walked the entire unit 3 times independently (assist just for lines).   He was on 3L O2 at rest with SpO2 90.  With mobility he dropped to 83, once bumped to 6L he maintained 90 with mobility.  He showed no signs of shortness of breath or fatigue.  Completed toileting and grooming at sink.  Patient needing to improve breathing techniques and activity tolerance, as evidenced by high O2 need. Will continue to follow with OT acutely to address the deficits listed below.  Follow Up Recommendations  No OT follow up    Equipment Recommendations  None recommended by OT    Recommendations for Other Services      Precautions / Restrictions Precautions Precautions: Other (comment) Precaution Comments: desats w/ activity, poor insight into deficits Restrictions Weight Bearing Restrictions: No       Mobility Bed Mobility Overal bed mobility: Independent                Transfers Overall transfer level: Independent Equipment used: None                  Balance Overall balance assessment: No apparent balance deficits (not formally assessed)                                         ADL either performed or assessed with clinical judgement   ADL Overall ADL's : Independent     Grooming: Wash/dry hands;Wash/dry  face;Oral care;Independent;Standing                   Toilet Transfer: Independent;Ambulation   Toileting- Clothing Manipulation and Hygiene: Independent;Sit to/from stand       Functional mobility during ADLs: Independent General ADL Comments: Patient independent but needs reminders to take things slow and take rest breaks     Vision       Perception     Praxis      Cognition Arousal/Alertness: Awake/alert Behavior During Therapy: WFL for tasks assessed/performed Overall Cognitive Status: Within Functional Limits for tasks assessed                                 General Comments: seems to be functional but has poor awereness of deficits        Exercises Exercises: Other exercises Other Exercises Other Exercises: pursed lip breathing Other Exercises: flutter valve x 10 Other Exercises: incentive spirometer x 10 pulls max 2554ml Other Exercises: Walk 1000 ft   Shoulder Instructions       General Comments      Pertinent Vitals/ Pain       Pain Assessment: No/denies pain  Home Living  Prior Functioning/Environment              Frequency  Min 3X/week        Progress Toward Goals  OT Goals(current goals can now be found in the care plan section)  Progress towards OT goals: Progressing toward goals  Acute Rehab OT Goals Patient Stated Goal: Go back to family OT Goal Formulation: With patient Time For Goal Achievement: 05/08/19 Potential to Achieve Goals: Good  Plan Discharge plan remains appropriate    Co-evaluation                 AM-PAC OT "6 Clicks" Daily Activity     Outcome Measure   Help from another person eating meals?: None Help from another person taking care of personal grooming?: None Help from another person toileting, which includes using toliet, bedpan, or urinal?: None Help from another person bathing (including washing, rinsing,  drying)?: None Help from another person to put on and taking off regular upper body clothing?: None Help from another person to put on and taking off regular lower body clothing?: None 6 Click Score: 24    End of Session Equipment Utilized During Treatment: Oxygen  OT Visit Diagnosis: Muscle weakness (generalized) (M62.81)   Activity Tolerance Patient tolerated treatment well   Patient Left in chair;with call bell/phone within reach   Nurse Communication Mobility status        Time: 9390-3009 OT Time Calculation (min): 31 min  Charges: OT General Charges $OT Visit: 1 Visit OT Treatments $Self Care/Home Management : 8-22 mins $Therapeutic Activity: 8-22 mins  [name], OTR/L    [name] 04/26/2019, 3:47 PM

## 2019-04-26 NOTE — Plan of Care (Signed)
  Problem: Education: Goal: Knowledge of risk factors and measures for prevention of condition will improve Outcome: Progressing   Problem: Coping: Goal: Psychosocial and spiritual needs will be supported Outcome: Progressing   Problem: Respiratory: Goal: Will maintain a patent airway Outcome: Progressing   

## 2019-04-26 NOTE — Progress Notes (Addendum)
PROGRESS NOTE    Daniel Jensen  TZG:[id number] DOB: [birth year] DOA: 04/23/2019 PCP: Mateo Flow, MD   Brief Narrative:  Daniel Jensen is Daniel Jensen 59 y.o. male with medical history of recent infection with COVID-19 who was admitted to [facility] on 2/22.  Pulse ox was initially 80% on room air and CRP greater than 150.  Chest x-ray revealed patchy bilateral basilar pneumonia.  Daniel Jensen CTA was negative for PE but revealed bilateral groundglass opacities and Daniel Jensen basilar predominant distribution consistent with COVID-19 pneumonia.  The patient was initially started on 2 L of oxygen.  He was also given IV Decadron, Remdesivir and Actemra. He was initially given Ceftriaxone and Azithromycin as well but these were discontinued.  The patient's oxygen requirements have increased and he was increased steadily to 6 L of nasal cannula.  It was then decided that the patient be transferred to [facility].   Assessment & Plan:   Principal Problem:   Pneumonia due to COVID-19 virus Active Problems:   Acute respiratory failure with hypoxemia (HCC)   Thyroid nodule  Pneumonia due to COVID-19 virus   Acute respiratory failure with hypoxemia ([place]) - He tested positive for COVID 19 on 2/14- he believes he contracted it at work - currently requiring 4-5 L [place], fluctuating - wean as tolerated - cont steroids and Remdesivir (first dose at [place] on 2/22, 200 mg dose) (completed remdesivir 2/26) - s/p actemra at [facility]  - add Combivent and Protonix - cont incentive spirometry, Vit C and Zinc - daily labs ordered - I/O, daily weights - IS, OOB, prone as able - rising d dimer, mild, follow LE [place] (negative for DVT) - repeat CXR 2/26  COVID-19 Labs  Recent Labs    04/24/19 0310 04/25/19 0213 04/26/19 0249  DDIMER 0.80* 1.45* 1.01*  FERRITIN 864* 714* 599*  CRP 5.9* 3.0* 1.5*    No results found for: SARSCOV2NAA   Left thyroid nodule  - incidental finding on CT - outpatient work up with  thyroid US recommended  Sinus Bradycardia Asymptomatic, continue to monitor  DVT prophylaxis: lovenox Code Status: full Family Communication: none at bedside - wife 2/26 Disposition Plan:  . Patient came from: home           . Anticipated d/c place: home . Barriers to d/c OR conditions which need to be met to effect Flora Ratz safe d/c: pending further improvement in oxygenation  Consultants:   none  Procedures:   OSH CT Scan IMPRESSION: 1. No pulmonary embolus. 2. Geographic bilateral ground-glass opacities in Daylen Hack basilar predominant distribution consistent with COVID-19 pneumonia. Parenchymal involvement is moderate to extensive. 3. Left thyroid nodule measuring 19 mm. Recommend thyroid US (ref: J Am Coll Radiol. 2015 Feb;12(2): 143-50). 4. Prominent right hilar node is likely reactive.   Electronically Signed   By: [name] M.D.   On: 04/22/2019 19:02  Antimicrobials:  Anti-infectives (From admission, onward)   Start     Dose/Rate Route Frequency Ordered Stop   04/23/19 1800  remdesivir 100 mg in sodium chloride 0.9 % 100 mL IVPB     100 mg 200 mL/hr over 30 Minutes Intravenous Daily 04/23/19 1625 04/26/19 0845     Subjective: No new complaints  Objective: Vitals:   04/26/19 0039 04/26/19 0341 04/26/19 0744 04/26/19 1143  BP: 128/77 126/72  103/75  Pulse: (!) 57 60  84  Resp: _0 Temp: 97.9 F (36.6 C) 97.7 F (36.5 C) 98.1 F (36.7 C)  98.1 F (36.7 C)  TempSrc: Oral Oral Oral Oral  SpO2: 91% 92%  (!) 88%  Weight:      Height:        Intake/Output Summary (Last 24 hours) at 04/26/2019 1201 Last data filed at 04/26/2019 0554 Gross per 24 hour  Intake 440 ml  Output 1000 ml  Net -560 ml   Filed Weights   04/23/19 1458  Weight: 83.4 kg    Examination:  General: No acute distress. Cardiovascular: Heart sounds show Kahlani Graber regular rate, and rhythm.  Lungs: Clear to auscultation bilaterally  Abdomen: Soft, nontender, nondistended    Neurological: Alert and oriented 3. Moves all extremities 4  Cranial nerves II through XII grossly intact. Skin: Warm and dry. No rashes or lesions. Extremities: No clubbing or cyanosis. No edema.  Data Reviewed: I have personally reviewed following labs and imaging studies  CBC: Recent Labs  Lab 04/23/19 1630 04/24/19 0310 04/25/19 0213 04/26/19 0249  WBC 7.7 8.9 8.1 10.6*  NEUTROABS  --  7.5 7.4 9.6*  HGB 13.9 13.4 13.3 13.1  HCT 40.9 40.7 39.8 39.0  MCV 91.1 91.7 90.7 90.5  PLT 266 309 328 876   Basic Metabolic Panel: Recent Labs  Lab 04/23/19 1630 04/24/19 0310 04/25/19 0213 04/26/19 0249  NA  --  141 140 140  K  --  4.2 4.3 4.4  CL  --  108 107 107  CO2  --  _0 GLUCOSE  --  99 138* 122*  BUN  --  28* 25* 30*  CREATININE 0.86 0.88 0.78 0.82  CALCIUM  --  8.3* 8.3* 8.3*  MG  --  2.2 2.1 2.0   GFR: Estimated Creatinine Clearance: 104.6 mL/min (by C-G formula based on SCr of 0.82 mg/dL). Liver Function Tests: Recent Labs  Lab 04/24/19 0310 04/25/19 0213 04/26/19 0249  AST 41 41 59*  ALT 39 40 65*  ALKPHOS 43 41 36*  BILITOT 0.5 0.8 0.8  PROT 6.1* 6.0* 5.9*  ALBUMIN 2.7* 2.7* 2.8*   No results for input(s): LIPASE, AMYLASE in the last 168 hours. No results for input(s): AMMONIA in the last 168 hours. Coagulation Profile: No results for input(s): INR, PROTIME in the last 168 hours. Cardiac Enzymes: No results for input(s): CKTOTAL, CKMB, CKMBINDEX, TROPONINI in the last 168 hours. BNP (last 3 results) No results for input(s): PROBNP in the last 8760 hours. HbA1C: Recent Labs    04/23/19 1630  HGBA1C 5.4   CBG: Recent Labs  Lab 04/24/19 1116 04/24/19 1646 04/24/19 2155 04/25/19 2123 04/26/19 0743  GLUCAP 137* 136* 148* 112* 95   Lipid Profile: No results for input(s): CHOL, HDL, LDLCALC, TRIG, CHOLHDL, LDLDIRECT in the last 72 hours. Thyroid Function Tests: No results for input(s): TSH, T4TOTAL, FREET4, T3FREE, THYROIDAB in the  last 72 hours. Anemia Panel: Recent Labs    04/25/19 0213 04/26/19 0249  FERRITIN 714* 599*   Sepsis Labs: No results for input(s): PROCALCITON, LATICACIDVEN in the last 168 hours.  No results found for this or any previous visit (from the past 240 hour(s)).       Radiology Studies: DG CHEST PORT 1 VIEW  Result Date: 04/26/2019 CLINICAL DATA:  Hypoxia EXAM: PORTABLE CHEST 1 VIEW COMPARISON:  Radiograph 04/22/2019, CT 04/22/2019 FINDINGS: Stable cardiac silhouette. There is peripheral airspace disease increased from comparison exam. Lung bases are clear. No pleural fluid. No pneumothorax. IMPRESSION: Peripheral bilateral airspace disease consistent COVID pneumonia. Mild increase in density of airspace disease.  Electronically Signed   By: Suzy Bouchard M.D.   On: 04/26/2019 08:56   VAS [place] LOWER EXTREMITY VENOUS (DVT)  Result Date: 04/25/2019  Lower Venous DVTStudy Indications: Elevated ddimer.  Comparison Study: no prior Performing Technologist: Abram Sander RVS  Examination Guidelines: Alycia Cooperwood complete evaluation includes B-mode imaging, spectral Doppler, color Doppler, and power Doppler as needed of all accessible portions of each vessel. Bilateral testing is considered an integral part of Ghali Morissette complete examination. Limited examinations for reoccurring indications may be performed as noted. The reflux portion of the exam is performed with the patient in reverse Trendelenburg.  +---------+---------------+---------+-----------+----------+--------------+ RIGHT    CompressibilityPhasicitySpontaneityPropertiesThrombus Aging +---------+---------------+---------+-----------+----------+--------------+ CFV      Full           Yes      Yes                                 +---------+---------------+---------+-----------+----------+--------------+ SFJ      Full                                                        +---------+---------------+---------+-----------+----------+--------------+  FV Prox  Full                                                        +---------+---------------+---------+-----------+----------+--------------+ FV Mid   Full                                                        +---------+---------------+---------+-----------+----------+--------------+ FV DistalFull                                                        +---------+---------------+---------+-----------+----------+--------------+ PFV      Full                                                        +---------+---------------+---------+-----------+----------+--------------+ POP      Full           Yes      Yes                                 +---------+---------------+---------+-----------+----------+--------------+ PTV      Full                                                        +---------+---------------+---------+-----------+----------+--------------+ PERO  Full                                                        +---------+---------------+---------+-----------+----------+--------------+   +---------+---------------+---------+-----------+----------+--------------+ LEFT     CompressibilityPhasicitySpontaneityPropertiesThrombus Aging +---------+---------------+---------+-----------+----------+--------------+ CFV      Full           Yes      Yes                                 +---------+---------------+---------+-----------+----------+--------------+ SFJ      Full                                                        +---------+---------------+---------+-----------+----------+--------------+ FV Prox  Full                                                        +---------+---------------+---------+-----------+----------+--------------+ FV Mid   Full                                                        +---------+---------------+---------+-----------+----------+--------------+ FV DistalFull                                                         +---------+---------------+---------+-----------+----------+--------------+ PFV      Full                                                        +---------+---------------+---------+-----------+----------+--------------+ POP      Full           Yes      Yes                                 +---------+---------------+---------+-----------+----------+--------------+ PTV      Full                                                        +---------+---------------+---------+-----------+----------+--------------+ PERO  Not visualized +---------+---------------+---------+-----------+----------+--------------+     Summary: BILATERAL: - No evidence of deep vein thrombosis seen in the lower extremities, bilaterally.   *See table(s) above for measurements and observations. Electronically signed by Monica Martinez MD on 04/25/2019 at 7:02:34 PM.    Final         Scheduled Meds: . vitamin C  500 mg Oral Daily  . enoxaparin (LOVENOX) injection  40 mg Subcutaneous Q24H  . Ipratropium-Albuterol  1 puff Inhalation QID  . methylPREDNISolone (SOLU-MEDROL) injection  40 mg Intravenous Q12H  . pantoprazole  40 mg Oral Daily  . zinc sulfate  220 mg Oral Daily   Continuous Infusions:    LOS: 3 days    Time spent: over 30 min    Fayrene Helper, MD [facility]   To contact the attending provider between 7A-7P or the covering provider during after hours 7P-7A, please log into the web site [email or web address] and access using universal [place] password for that web site. If you do not have the password, please call the hospital operator.  04/26/2019, 12:01 PM

## 2019-04-26 NOTE — Progress Notes (Signed)
Called and spoke with the patient's wife Melody. Answered all questions and concerns at this time.

## 2019-04-27 LAB — CBC WITH DIFFERENTIAL/PLATELET
Abs Immature Granulocytes: 0.09 10*3/uL — ABNORMAL HIGH (ref 0.00–0.07)
Basophils Absolute: 0 10*3/uL (ref 0.0–0.1)
Basophils Relative: 0 %
Eosinophils Absolute: 0 10*3/uL (ref 0.0–0.5)
Eosinophils Relative: 0 %
HCT: 41.1 % (ref 39.0–52.0)
Hemoglobin: 13.8 g/dL (ref 13.0–17.0)
Immature Granulocytes: 1 %
Lymphocytes Relative: 5 %
Lymphs Abs: 0.5 10*3/uL — ABNORMAL LOW (ref 0.7–4.0)
MCH: 30.1 pg (ref 26.0–34.0)
MCHC: 33.6 g/dL (ref 30.0–36.0)
MCV: 89.7 fL (ref 80.0–100.0)
Monocytes Absolute: 0.3 10*3/uL (ref 0.1–1.0)
Monocytes Relative: 3 %
Neutro Abs: 9.5 10*3/uL — ABNORMAL HIGH (ref 1.7–7.7)
Neutrophils Relative %: 91 %
Platelets: 424 10*3/uL — ABNORMAL HIGH (ref 150–400)
RBC: 4.58 MIL/uL (ref 4.22–5.81)
RDW: 12.8 % (ref 11.5–15.5)
WBC: 10.3 10*3/uL (ref 4.0–10.5)
nRBC: 0 % (ref 0.0–0.2)

## 2019-04-27 LAB — COMPREHENSIVE METABOLIC PANEL
ALT: 79 U/L — ABNORMAL HIGH (ref 0–44)
AST: 53 U/L — ABNORMAL HIGH (ref 15–41)
Albumin: 3 g/dL — ABNORMAL LOW (ref 3.5–5.0)
Alkaline Phosphatase: 39 U/L (ref 38–126)
Anion gap: 11 (ref 5–15)
BUN: 26 mg/dL — ABNORMAL HIGH (ref 6–20)
CO2: 23 mmol/L (ref 22–32)
Calcium: 8.7 mg/dL — ABNORMAL LOW (ref 8.9–10.3)
Chloride: 105 mmol/L (ref 98–111)
Creatinine, Ser: 0.89 mg/dL (ref 0.61–1.24)
GFR calc Af Amer: >60 mL/min (ref >60)
GFR calc non Af Amer: >60 mL/min (ref >60)
Glucose, Bld: 133 mg/dL — ABNORMAL HIGH (ref 70–99)
Potassium: 4.6 mmol/L (ref 3.5–5.1)
Sodium: 139 mmol/L (ref 135–145)
Total Bilirubin: 0.6 mg/dL (ref 0.3–1.2)
Total Protein: 6.1 g/dL — ABNORMAL LOW (ref 6.5–8.1)

## 2019-04-27 LAB — GLUCOSE, CAPILLARY
Glucose-Capillary: 105 mg/dL — ABNORMAL HIGH (ref 70–99)
Glucose-Capillary: 129 mg/dL — ABNORMAL HIGH (ref 70–99)

## 2019-04-27 LAB — FERRITIN: Ferritin: 511 ng/mL — ABNORMAL HIGH (ref 24–336)

## 2019-04-27 LAB — C-REACTIVE PROTEIN: CRP: 0.9 mg/dL (ref <1.0)

## 2019-04-27 LAB — D-DIMER, QUANTITATIVE: D-Dimer, Quant: 1.18 ug/mL-FEU — ABNORMAL HIGH (ref 0.00–0.50)

## 2019-04-27 LAB — MAGNESIUM: Magnesium: 2.1 mg/dL (ref 1.7–2.4)

## 2019-04-27 NOTE — TOC Transition Note (Signed)
Transition of Care [facility]) - CM/SW Discharge Note   Patient Details  Name: Hari Casaus MRN: [id number] Date of Birth: 1960/03/09  [facility] CM/SW Contact:  Durenda Guthrie, RN Phone Number: [phone] (working remotely) 04/27/2019, 12:17 PM   Clinical Narrative:   Case manager contacted patient's wife, Melody Gynn concerning patient's need for oxygen at discharge.Referral for oxygen was called to Durward Fortes with [employer].  CM explained to Mrs. Gynn that she will receive a call from an [employer] representative to confirm delivery. Case manager called Grayce Sessions at [facility] and requested that a tank from [employer] be taken to patient's room. He will discharge home when concentrator has been setup at the home. Bedside RN aware.      Final next level of care: Home/Self Care Barriers to Discharge: No Barriers Identified   Patient Goals and CMS Choice     Choice offered to / list presented to : Spouse  Discharge Placement                       Discharge Plan and Services   Discharge Planning Services: CM Consult Post Acute Care Choice: Durable Medical Equipment          DME Arranged: Oxygen DME Agency: [facility] Date DME Agency Contacted: 04/27/19 Time DME Agency Contacted: 1210     HH Agency: NA        Social Determinants of Health (SDOH) Interventions     Readmission Risk Interventions No flowsheet data found.

## 2019-04-27 NOTE — Progress Notes (Signed)
Patient had PIV's removed and was able to shower per MD.  Daniel Jensen understanding of all DC paperwork. Waiting for wife to arrive. Concentrators delivered to house per wife.

## 2019-04-27 NOTE — Discharge Summary (Signed)
Physician Discharge Summary  Daniel Jensen HER:[id number] DOB: [birth year] DOA: 04/23/2019  PCP: Lise Auer, MD  Admit date: 04/23/2019 Discharge date: 04/27/2019  Time spent: 40 minutes  Recommendations for Outpatient Follow-up:  1. Follow outpatient CBC/CMP 2. Follow oxygen need outpatient - discharged with 3 L oxygen with activity 3. Follow outpatient CXR 4. Quarantine per CDC guidelines - until March 7th. 5. Follow thyroid nodule outpatient with PCP (needs outpatient [place])  Discharge Diagnoses:  Principal Problem:   Pneumonia due to COVID-19 virus Active Problems:   Acute respiratory failure with hypoxemia (HCC)   Thyroid nodule   Discharge Condition: stable  Diet recommendation: heart healthy  Filed Weights   04/23/19 1458  Weight: 83.4 kg    History of present illness:  Daniel Jensen 59 y.o.malewith medical history ofrecent infection with COVID-19 who was admitted to [facility] on 2/22. Pulse ox was initially 80% on room air and CRP greater than 150. Chest x-ray revealed patchy bilateral basilarpneumonia. Daniel Jensen CTA was negative for PE but revealed bilateral groundglass opacities and Daniel Jensen basilar predominant distribution consistent with COVID-19 pneumonia. The patient was initially started on 2 L of oxygen. He was also given IV Decadron, Remdesivir and Actemra.He was initially given Ceftriaxone and Azithromycin as well but these were discontinued. The patient's oxygen requirementshave increasedand he was increased steadily to 6 L of nasal cannula. It was thendecided that the patient be transferred to [facility].   He was admitted for COVID infection diagnosed on 2/14.  He was initially treated at [facility], but had progressive hypoxia.  He was transferred to [facility].  He's improved on steroids, remdesivir, and actemra.  He was discharged on 2/27 on supplemental oxygen with activity.  Hospital Course:  Pneumonia due to COVID-19 virus Acute  respiratory failure with hypoxemia (HCC) - He tested positive for COVID 19 on 2/14- he believes he contracted it at work - on RA at rest, needs 3 L with activity.  Discharged with supplemental oxygen with activity. - cont steroids and Remdesivir (first dose at [facility] on 2/22, 200 mg dose) (completed remdesivir 2/26) - s/p actemra at [facility]  - add Combivent and Protonix - cont incentive spirometry, Vit C and Zinc - daily labs ordered - I/O, daily weights - IS, OOB, prone as able - rising d dimer, mild, follow LE [place] (negative for DVT) - repeat CXR 2/26 - peripheral bilateral airpsace disease consistent with COVID pneumonia, mild increase in density of airspace disease  COVID-19 Labs  Recent Labs    04/25/19 0213 04/26/19 0249 04/27/19 0424  DDIMER 1.45* 1.01* 1.18*  FERRITIN 714* 599* 511*  CRP 3.0* 1.5* 0.9    No results found for: [id number]  Left thyroid nodule  - incidental finding on CT - outpatient work up with thyroid US recommended - discussed with pt need for [place] prior to d/c  Sinus Bradycardia Asymptomatic, continue to monitor  Procedures: LE [place] Summary:  BILATERAL:  - No evidence of deep vein thrombosis seen in the lower extremities,  bilaterally.  Consultations:  none  Discharge Exam: Vitals:   04/27/19 0420 04/27/19 0700  BP: (!) 90/59 (!) 98/59  Pulse: 74 74  Resp: 20 20  Temp: 97.9 F (36.6 C) (!) 97.5 F (36.4 C)  SpO2: 92% 91%   Feels well, eager to dsicharge home  General: No acute distress. Cardiovascular: Heart sounds show Daniel Jensen regular rate, and rhythm.  Lungs: Clear to auscultation bilaterally Abdomen: Soft, nontender, nondistended  Neurological: Alert and oriented 3.  Moves all extremities 4 . Cranial nerves II through XII grossly intact. Skin: Warm and dry. No rashes or lesions. Extremities: No clubbing or cyanosis. No edema.  Discharge Instructions   Discharge Instructions    Call MD for:  difficulty breathing, headache or  visual disturbances   Complete by: As directed    Call MD for:  extreme fatigue   Complete by: As directed    Call MD for:  hives   Complete by: As directed    Call MD for:  persistant dizziness or light-headedness   Complete by: As directed    Call MD for:  persistant nausea and vomiting   Complete by: As directed    Call MD for:  redness, tenderness, or signs of infection (pain, swelling, redness, odor or green/yellow discharge around incision site)   Complete by: As directed    Call MD for:  severe uncontrolled pain   Complete by: As directed    Call MD for:  temperature >100.4   Complete by: As directed    Diet - low sodium heart healthy   Complete by: As directed    Discharge instructions   Complete by: As directed    You were seen for COVID pneumonia.  You've improved with steroids, remdesivir, and actemra.   You currently are requiring oxygen with activity.  We'll send you home with 3 L of oxygen you can use with activity and at night.  You will need to continue to quarantine until March 7th.  You can discontinue quarantine if you've continued to improve without using fever reducing medications after March 7th.  Call your PCP or the health department with questions.  You need an ultrasound of your thyroid outpatient for Daniel Jensen thyroid nodule.  Please follow up with your PCP regarding this.  Return for new, recurrent, or worsening symptoms.  Please ask your PCP to request records from this hospitalization so they know what was done and what the next steps will be.   Increase activity slowly   Complete by: As directed    MyChart COVID-19 home monitoring program   Complete by: Apr 27, 2019    Is the patient willing to use the MyChart Mobile App for home monitoring?: Yes   Temperature monitoring   Complete by: Apr 27, 2019    After how many days would you like to receive Daniel Jensen notification of this patient's flowsheet entries?: 1     Allergies as of 04/27/2019   No Known  Allergies     Medication List    STOP taking these medications   azithromycin 250 MG tablet Commonly known as: ZITHROMAX   methylPREDNISolone 4 MG Tbpk tablet Commonly known as: MEDROL DOSEPAK     TAKE these medications   benzonatate 200 MG capsule Commonly known as: TESSALON Take 400 mg by mouth every 8 (eight) hours as needed for cough.            Durable Medical Equipment  (From admission, onward)         Start     Ordered   04/27/19 1135  DME Oxygen  Once    Comments: SATURATION QUALIFICATIONS: (This note is used to comply with regulatory documentation for home oxygen)  Patient Saturations on Room Air at Rest = 94%  Patient Saturations on Room Air while Ambulating = 85%  Patient Saturations on 3 Liters of oxygen while Ambulating = 90%  Please briefly explain why patient needs home oxygen: Pt requires oxygen with activity as saturations  fall below 88% on room air with activity.  Question Answer Comment  Length of Need 12 Months   Mode or (Route) Nasal cannula   Liters per Minute 3   Frequency Continuous (stationary and portable oxygen unit needed)   Oxygen conserving device Yes   Oxygen delivery system Gas      04/27/19 1142         No Known Allergies Follow-up Information    Lise Auer, MD. Schedule an appointment as soon as possible for Daniel Jensen visit in 2 week(s).   Specialty: Family Medicine Contact information: [address] [place] [address] [phone]        [employer] Follow up.   Why: Daniel Jensen representative from [employer] will deliver oxygen concentrator and tanks to your home. Should you have any concerns with the tanks contact the number listed for [employer].  Contact information: [address] [place] [address] [phone]            The results of significant diagnostics from this hospitalization (including imaging, microbiology, ancillary and laboratory) are listed below for reference.     Significant Diagnostic Studies: DG CHEST PORT 1 VIEW  Result Date: 04/26/2019 CLINICAL DATA:  Hypoxia EXAM: PORTABLE CHEST 1 VIEW COMPARISON:  Radiograph 04/22/2019, CT 04/22/2019 FINDINGS: Stable cardiac silhouette. There is peripheral airspace disease increased from comparison exam. Lung bases are clear. No pleural fluid. No pneumothorax. IMPRESSION: Peripheral bilateral airspace disease consistent COVID pneumonia. Mild increase in density of airspace disease. Electronically Signed   By: Genevive Bi M.D.   On: 04/26/2019 08:56   VAS [place] LOWER EXTREMITY VENOUS (DVT)  Result Date: 04/25/2019  Lower Venous DVTStudy Indications: Elevated ddimer.  Comparison Study: no prior Performing Technologist: Daniel Jensen RVS  Examination Guidelines: Daniel Jensen complete evaluation includes B-mode imaging, spectral Doppler, color Doppler, and power Doppler as needed of all accessible portions of each vessel. Bilateral testing is considered an integral part of Daniel Jensen complete examination. Limited examinations for reoccurring indications may be performed as noted. The reflux portion of the exam is performed with the patient in reverse Trendelenburg.  +---------+---------------+---------+-----------+----------+--------------+ RIGHT    CompressibilityPhasicitySpontaneityPropertiesThrombus Aging +---------+---------------+---------+-----------+----------+--------------+ CFV      Full           Yes      Yes                                 +---------+---------------+---------+-----------+----------+--------------+ SFJ      Full                                                        +---------+---------------+---------+-----------+----------+--------------+ FV Prox  Full                                                        +---------+---------------+---------+-----------+----------+--------------+ FV Mid   Full                                                         +---------+---------------+---------+-----------+----------+--------------+  FV DistalFull                                                        +---------+---------------+---------+-----------+----------+--------------+ PFV      Full                                                        +---------+---------------+---------+-----------+----------+--------------+ POP      Full           Yes      Yes                                 +---------+---------------+---------+-----------+----------+--------------+ PTV      Full                                                        +---------+---------------+---------+-----------+----------+--------------+ PERO     Full                                                        +---------+---------------+---------+-----------+----------+--------------+   +---------+---------------+---------+-----------+----------+--------------+ LEFT     CompressibilityPhasicitySpontaneityPropertiesThrombus Aging +---------+---------------+---------+-----------+----------+--------------+ CFV      Full           Yes      Yes                                 +---------+---------------+---------+-----------+----------+--------------+ SFJ      Full                                                        +---------+---------------+---------+-----------+----------+--------------+ FV Prox  Full                                                        +---------+---------------+---------+-----------+----------+--------------+ FV Mid   Full                                                        +---------+---------------+---------+-----------+----------+--------------+ FV DistalFull                                                        +---------+---------------+---------+-----------+----------+--------------+   PFV      Full                                                         +---------+---------------+---------+-----------+----------+--------------+ POP      Full           Yes      Yes                                 +---------+---------------+---------+-----------+----------+--------------+ PTV      Full                                                        +---------+---------------+---------+-----------+----------+--------------+ PERO                                                  Not visualized +---------+---------------+---------+-----------+----------+--------------+     Summary: BILATERAL: - No evidence of deep vein thrombosis seen in the lower extremities, bilaterally.   *See table(s) above for measurements and observations. Electronically signed by Sherald Hess MD on 04/25/2019 at 7:02:34 PM.    Final     Microbiology: No results found for this or any previous visit (from the past 240 hour(s)).   Labs: Basic Metabolic Panel: Recent Labs  Lab 04/23/19 1630 04/24/19 0310 04/25/19 0213 04/26/19 0249 04/27/19 0424  NA  --  141 140 140 139  K  --  4.2 4.3 4.4 4.6  CL  --  108 107 107 105  CO2  --  26 23 24 23   GLUCOSE  --  99 138* 122* 133*  BUN  --  28* 25* 30* 26*  CREATININE 0.86 0.88 0.78 0.82 0.89  CALCIUM  --  8.3* 8.3* 8.3* 8.7*  MG  --  2.2 2.1 2.0 2.1   Liver Function Tests: Recent Labs  Lab 04/24/19 0310 04/25/19 0213 04/26/19 0249 04/27/19 0424  AST 41 41 59* 53*  ALT 39 40 65* 79*  ALKPHOS 43 41 36* 39  BILITOT 0.5 0.8 0.8 0.6  PROT 6.1* 6.0* 5.9* 6.1*  ALBUMIN 2.7* 2.7* 2.8* 3.0*   No results for input(s): LIPASE, AMYLASE in the last 168 hours. No results for input(s): AMMONIA in the last 168 hours. CBC: Recent Labs  Lab 04/23/19 1630 04/24/19 0310 04/25/19 0213 04/26/19 0249 04/27/19 0424  WBC 7.7 8.9 8.1 10.6* 10.3  NEUTROABS  --  7.5 7.4 9.6* 9.5*  HGB 13.9 13.4 13.3 13.1 13.8  HCT 40.9 40.7 39.8 39.0 41.1  MCV 91.1 91.7 90.7 90.5 89.7  PLT 266 309 328 367 424*   Cardiac Enzymes: No  results for input(s): CKTOTAL, CKMB, CKMBINDEX, TROPONINI in the last 168 hours. BNP: BNP (last 3 results) No results for input(s): BNP in the last 8760 hours.  ProBNP (last 3 results) No results for input(s): PROBNP in the last 8760 hours.  CBG: Recent Labs  Lab 04/26/19 0743 04/26/19 1142 04/26/19 1614 04/26/19 2052 04/27/19 1137  GLUCAP 95  91 128* 123* 129*       Signed:  Fayrene Helper MD.  [facility] 04/27/2019, 2:08 PM

## 2019-04-27 NOTE — Progress Notes (Signed)
SATURATION QUALIFICATIONS: (This note is used to comply with regulatory documentation for home oxygen)  Patient Saturations on Room Air at Rest = 94%  Patient Saturations on Room Air while Ambulating = 85%  Patient Saturations on 3 Liters of oxygen while Ambulating = 90%  Please briefly explain why patient needs home oxygen:

## 2019-04-27 NOTE — Progress Notes (Signed)
Spoke to patients wife, she will head this way to pick up the patient.

## 2019-04-27 NOTE — Discharge Instructions (Signed)
Continue to quarantine until March 7th.  You can discontinue quarantine if you've continued to improve without using fever reducing meds after March 7th.  Call your PCP or the health department with questions.     Person Under Monitoring Name: Daniel Jensen  Location: [place] [address]   Infection Prevention Recommendations for Individuals Confirmed to have, or Being Evaluated for, 2019 Novel Coronavirus (COVID-19) Infection Who Receive Care at Home  Individuals who are confirmed to have, or are being evaluated for, COVID-19 should follow the prevention steps below until [name] healthcare provider or local or state health department says they can return to normal activities.  Stay home except to get medical care You should restrict activities outside your home, except for getting medical care. Do not go to work, school, or public areas, and do not use public transportation or taxis.  Call ahead before visiting your doctor Before your medical appointment, call the healthcare provider and tell them that you have, or are being evaluated for, COVID-19 infection. This will help the healthcare provider's office take steps to keep other people from getting infected. Ask your healthcare provider to call the local or state health department.  Monitor your symptoms Seek prompt medical attention if your illness is worsening (e.g., difficulty breathing). Before going to your medical appointment, call the healthcare provider and tell them that you have, or are being evaluated for, COVID-19 infection. Ask your healthcare provider to call the local or state health department.  Wear Raistlin Gum facemask You should wear Asa Fath facemask that covers your nose and mouth when you are in the same room with other people and when you visit Shiah Berhow healthcare provider. People who live with or visit you should also wear Gee Habig facemask while they are in the same room with you.  Separate yourself from other  people in your home As much as possible, you should stay in Ector Laurel different room from other people in your home. Also, you should use Chantal Worthey separate bathroom, if available.  Avoid sharing household items You should not share dishes, drinking glasses, cups, eating utensils, towels, bedding, or other items with other people in your home. After using these items, you should wash them thoroughly with soap and water.  Cover your coughs and sneezes Cover your mouth and nose with Jeferson Boozer tissue when you cough or sneeze, or you can cough or sneeze into your sleeve. Throw used tissues in Vear Staton lined trash can, and immediately wash your hands with soap and water for at least 20 seconds or use an alcohol-based hand rub.  Wash your [facility] your hands often and thoroughly with soap and water for at least 20 seconds. You can use an alcohol-based hand sanitizer if soap and water are not available and if your hands are not visibly dirty. Avoid touching your eyes, nose, and mouth with unwashed hands.   Prevention Steps for Caregivers and Household Members of Individuals Confirmed to have, or Being Evaluated for, COVID-19 Infection Being Cared for in the Home  If you live with, or provide care at home for, Daniel Jensen person confirmed to have, or being evaluated for, COVID-19 infection please follow these guidelines to prevent infection:  Follow healthcare provider's instructions Make sure that you understand and can help the patient follow any healthcare provider instructions for all care.  Provide for the patient's basic needs You should help the patient with basic needs in the home and provide support for getting groceries, prescriptions, and other personal needs.  Monitor the patient's symptoms If they are getting sicker, call his or her medical provider and tell them that the patient has, or is being evaluated for, COVID-19 infection. This will help the healthcare provider's office take steps to keep other people from  getting infected. Ask the healthcare provider to call the local or state health department.  Limit the number of people who have contact with the patient  If possible, have only one caregiver for the patient.  Other household members should stay in another home or place of residence. If this is not possible, they should stay  in another room, or be separated from the patient as much as possible. Use Elza Sortor separate bathroom, if available.  Restrict visitors who do not have an essential need to be in the home.  Keep older adults, very young children, and other sick people away from the patient Keep older adults, very young children, and those who have compromised immune systems or chronic health conditions away from the patient. This includes people with chronic heart, lung, or kidney conditions, diabetes, and cancer.  Ensure good ventilation Make sure that shared spaces in the home have good air flow, such as from an air conditioner or an opened window, weather permitting.  Wash your hands often  Wash your hands often and thoroughly with soap and water for at least 20 seconds. You can use an alcohol based hand sanitizer if soap and water are not available and if your hands are not visibly dirty.  Avoid touching your eyes, nose, and mouth with unwashed hands.  Use disposable paper towels to dry your hands. If not available, use dedicated cloth towels and replace them when they become wet.  Wear Kerri-Anne Haeberle facemask and gloves  Wear Deigo Alonso disposable facemask at all times in the room and gloves when you touch or have contact with the patient's blood, body fluids, and/or secretions or excretions, such as sweat, saliva, sputum, nasal mucus, vomit, urine, or feces.  Ensure the mask fits over your nose and mouth tightly, and do not touch it during use.  Throw out disposable facemasks and gloves after using them. Do not reuse.  Wash your hands immediately after removing your facemask and gloves.  If your  personal clothing becomes contaminated, carefully remove clothing and launder. Wash your hands after handling contaminated clothing.  Place all used disposable facemasks, gloves, and other waste in Kely Dohn lined container before disposing them with other household waste.  Remove gloves and wash your hands immediately after handling these items.  Do not share dishes, glasses, or other household items with the patient  Avoid sharing household items. You should not share dishes, drinking glasses, cups, eating utensils, towels, bedding, or other items with Rylan Kaufmann patient who is confirmed to have, or being evaluated for, COVID-19 infection.  After the person uses these items, you should wash them thoroughly with soap and water.  Wash laundry thoroughly  Immediately remove and wash clothes or bedding that have blood, body fluids, and/or secretions or excretions, such as sweat, saliva, sputum, nasal mucus, vomit, urine, or feces, on them.  Wear gloves when handling laundry from the patient.  Read and follow directions on labels of laundry or clothing items and detergent. In general, wash and dry with the warmest temperatures recommended on the label.  Clean all areas the individual has used often  Clean all touchable surfaces, such as counters, tabletops, doorknobs, bathroom fixtures, toilets, phones, keyboards, tablets, and bedside tables, every day. Also, clean any surfaces that  may have blood, body fluids, and/or secretions or excretions on them.  Wear gloves when cleaning surfaces the patient has come in contact with.  Use Mattilyn Crites diluted bleach solution (e.g., dilute bleach with 1 part bleach and 10 parts water) or Seiya Silsby household disinfectant with Shaelynn Dragos label that says EPA-registered for coronaviruses. To make Rochel Privett bleach solution at home, add 1 tablespoon of bleach to 1 quart (4 cups) of water. For Nelida Mandarino larger supply, add  cup of bleach to 1 gallon (16 cups) of water.  Read labels of cleaning products and follow  recommendations provided on product labels. Labels contain instructions for safe and effective use of the cleaning product including precautions you should take when applying the product, such as wearing gloves or eye protection and making sure you have good ventilation during use of the product.  Remove gloves and wash hands immediately after cleaning.  Monitor yourself for signs and symptoms of illness Caregivers and household members are considered close contacts, should monitor their health, and will be asked to limit movement outside of the home to the extent possible. Follow the monitoring steps for close contacts listed on the symptom monitoring form.   ? If you have additional questions, contact your local health department or call the epidemiologist on call at [phone] (available 24/7). ? This guidance is subject to change. For the most up-to-date guidance from CDC, please refer to their website: TripMetro.hu  COVID-19 COVID-19 is Dewanna Hurston respiratory infection that is caused by Dannika Hilgeman virus called severe acute respiratory syndrome coronavirus 2 (SARS-CoV-2). The disease is also known as coronavirus disease or novel coronavirus. In some people, the virus may not cause any symptoms. In others, it may cause Sharicka Pogorzelski serious infection. The infection can get worse quickly and can lead to complications, such as:  Pneumonia, or infection of the lungs.  Acute respiratory distress syndrome or ARDS. This is Mychael Smock condition in which fluid build-up in the lungs prevents the lungs from filling with air and passing oxygen into the blood.  Acute respiratory failure. This is Chelcee Korpi condition in which there is not enough oxygen passing from the lungs to the body or when carbon dioxide is not passing from the lungs out of the body.  Sepsis or septic shock. This is Regan Mcbryar serious bodily reaction to an infection.  Blood clotting problems.  Secondary infections due  to bacteria or fungus.  Organ failure. This is when your body's organs stop working. The virus that causes COVID-19 is contagious. This means that it can spread from person to person through droplets from coughs and sneezes (respiratory secretions). What are the causes? This illness is caused by Deylan Canterbury virus. You may catch the virus by:  Breathing in droplets from an infected person. Droplets can be spread by Able Malloy person breathing, speaking, singing, coughing, or sneezing.  Touching something, like Nylan Nakatani table or Mearle Drew doorknob, that was exposed to the virus (contaminated) and then touching your mouth, nose, or eyes. What increases the risk? Risk for infection You are more likely to be infected with this virus if you:  Are within 6 feet (2 meters) of Evelyna Folker person with COVID-19.  Provide care for or live with Quoc Tome person who is infected with COVID-19.  Spend time in crowded indoor spaces or live in shared housing. Risk for serious illness You are more likely to become seriously ill from the virus if you:  Are 35 years of age or older. The higher your age, the more you are at risk for serious illness.  Live in Azana Kiesler nursing home or long-term care facility.  Have cancer.  Have Renay Crammer long-term (chronic) disease such as: ? Chronic lung disease, including chronic obstructive pulmonary disease or asthma. ? Wanda Rideout long-term disease that lowers your body's ability to fight infection (immunocompromised). ? Heart disease, including heart failure, Josiane Labine condition in which the arteries that lead to the heart become narrow or blocked (coronary artery disease), Case Vassell disease which makes the heart muscle thick, weak, or stiff (cardiomyopathy). ? Diabetes. ? Chronic kidney disease. ? Sickle cell disease, Annet Manukyan condition in which red blood cells have an abnormal "sickle" shape. ? Liver disease.  Are obese. What are the signs or symptoms? Symptoms of this condition can range from mild to severe. Symptoms may appear any time from 2 to 14 days  after being exposed to the virus. They include:  Karissa Meenan fever or chills.  Elizeo Rodriques cough.  Difficulty breathing.  Headaches, body aches, or muscle aches.  Runny or stuffy (congested) nose.  Navie Lamoreaux sore throat.  New loss of taste or smell. Some people may also have stomach problems, such as nausea, vomiting, or diarrhea. Other people may not have any symptoms of COVID-19. How is this diagnosed? This condition may be diagnosed based on:  Your signs and symptoms, especially if: ? You live in an area with Chastidy Ranker COVID-19 outbreak. ? You recently traveled to or from an area where the virus is common. ? You provide care for or live with Mellony Danziger person who was diagnosed with COVID-19. ? You were exposed to Shiniqua Groseclose person who was diagnosed with COVID-19.  Onda Kattner physical exam.  Lab tests, which may include: ? Taking Lavina Resor sample of fluid from the back of your nose and throat (nasopharyngeal fluid), your nose, or your throat using Ezequias Lard swab. ? Ladan Vanderzanden sample of mucus from your lungs (sputum). ? Blood tests.  Imaging tests, which may include, X-rays, CT scan, or ultrasound. How is this treated? At present, there is no medicine to treat COVID-19. Medicines that treat other diseases are being used on Deaaron Fulghum trial basis to see if they are effective against COVID-19. Your health care provider will talk with you about ways to treat your symptoms. For most people, the infection is mild and can be managed at home with rest, fluids, and over-the-counter medicines. Treatment for Daylen Hack serious infection usually takes places in Saydi Kobel hospital intensive care unit (ICU). It may include one or more of the following treatments. These treatments are given until your symptoms improve.  Receiving fluids and medicines through an IV.  Supplemental oxygen. Extra oxygen is given through Ruhama Lehew tube in the nose, Cinderella Christoffersen face mask, or Nelson Noone hood.  Positioning you to lie on your stomach (prone position). This makes it easier for oxygen to get into the lungs.  Continuous positive  airway pressure (CPAP) or bi-level positive airway pressure (BPAP) machine. This treatment uses mild air pressure to keep the airways open. Mazzie Brodrick tube that is connected to Sabien Umland motor delivers oxygen to the body.  Ventilator. This treatment moves air into and out of the lungs by using Kavitha Lansdale tube that is placed in your windpipe.  Tracheostomy. This is Alphonsa Brickle procedure to create Hannelore Bova hole in the neck so that Nai Borromeo breathing tube can be inserted.  Extracorporeal membrane oxygenation (ECMO). This procedure gives the lungs Marte Celani chance to recover by taking over the functions of the heart and lungs. It supplies oxygen to the body and removes carbon dioxide. Follow these instructions at home: Lifestyle  If you are sick, stay  home except to get medical care. Your health care provider will tell you how long to stay home. Call your health care provider before you go for medical care.  Rest at home as told by your health care provider.  Do not use any products that contain nicotine or tobacco, such as cigarettes, e-cigarettes, and chewing tobacco. If you need help quitting, ask your health care provider.  Return to your normal activities as told by your health care provider. Ask your health care provider what activities are safe for you. General instructions  Take over-the-counter and prescription medicines only as told by your health care provider.  Drink enough fluid to keep your urine pale yellow.  Keep all follow-up visits as told by your health care provider. This is important. How is this prevented?  There is no vaccine to help prevent COVID-19 infection. However, there are steps you can take to protect yourself and others from this virus. To protect yourself:   Do not travel to areas where COVID-19 is Kaylin Marcon risk. The areas where COVID-19 is reported change often. To identify high-risk areas and travel restrictions, check the CDC travel website: StageSync.si  If you live in, or must travel to, an area  where COVID-19 is Maripat Borba risk, take precautions to avoid infection. ? Stay away from people who are sick. ? Wash your hands often with soap and water for 20 seconds. If soap and water are not available, use an alcohol-based hand sanitizer. ? Avoid touching your mouth, face, eyes, or nose. ? Avoid going out in public, follow guidance from your state and local health authorities. ? If you must go out in public, wear Shandria Clinch cloth face covering or face mask. Make sure your mask covers your nose and mouth. ? Avoid crowded indoor spaces. Stay at least 6 feet (2 meters) away from others. ? Disinfect objects and surfaces that are frequently touched every day. This may include:  Counters and tables.  Doorknobs and light switches.  Sinks and faucets.  Electronics, such as phones, remote controls, keyboards, computers, and tablets. To protect others: If you have symptoms of COVID-19, take steps to prevent the virus from spreading to others.  If you think you have Alexus Galka COVID-19 infection, contact your health care provider right away. Tell your health care team that you think you may have Mackenzy Eisenberg COVID-19 infection.  Stay home. Leave your house only to seek medical care. Do not use public transport.  Do not travel while you are sick.  Wash your hands often with soap and water for 20 seconds. If soap and water are not available, use alcohol-based hand sanitizer.  Stay away from other members of your household. Let healthy household members care for children and pets, if possible. If you have to care for children or pets, wash your hands often and wear Hebe Merriwether mask. If possible, stay in your own room, separate from others. Use Tyreek Clabo different bathroom.  Make sure that all people in your household wash their hands well and often.  Cough or sneeze into Kreig Parson tissue or your sleeve or elbow. Do not cough or sneeze into your hand or into the air.  Wear Bandy Honaker cloth face covering or face mask. Make sure your mask covers your nose and  mouth. Where to find more information  Centers for Disease Control and Prevention: StickerEmporium.tn  World Health Organization: https://thompson-craig.com/ Contact Arthor Gorter health care provider if:  You live in or have traveled to an area where COVID-19 is Jarrett Chicoine risk and you  have symptoms of the infection.  You have had contact with someone who has COVID-19 and you have symptoms of the infection. Get help right away if:  You have trouble breathing.  You have pain or pressure in your chest.  You have confusion.  You have bluish lips and fingernails.  You have difficulty waking from sleep.  You have symptoms that get worse. These symptoms may represent Audrena Talaga serious problem that is an emergency. Do not wait to see if the symptoms will go away. Get medical help right away. Call your local emergency services (911 in the [place] Do not drive yourself to the hospital. Let the emergency medical personnel know if you think you have COVID-19. Summary  COVID-19 is Miata Culbreth respiratory infection that is caused by Cortavius Montesinos virus. It is also known as coronavirus disease or novel coronavirus. It can cause serious infections, such as pneumonia, acute respiratory distress syndrome, acute respiratory failure, or sepsis.  The virus that causes COVID-19 is contagious. This means that it can spread from person to person through droplets from breathing, speaking, singing, coughing, or sneezing.  You are more likely to develop Neomi Laidler serious illness if you are 72 years of age or older, have Milley Vining weak immune system, live in [facility] nursing home, or have chronic disease.  There is no medicine to treat COVID-19. Your health care provider will talk with you about ways to treat your symptoms.  Take steps to protect yourself and others from infection. Wash your hands often and disinfect objects and surfaces that are frequently touched every day. Stay away from people who are sick and wear Bevelyn Arriola mask if you are  sick. This information is not intended to replace advice given to you by your health care provider. Make sure you discuss any questions you have with your health care provider. Document Revised: 12/14/2018 Document Reviewed: 03/22/2018 Elsevier Patient Education  2020 [employer].

## 2019-05-16 ENCOUNTER — Other Ambulatory Visit: Payer: Self-pay

## 2021-01-25 IMAGING — DX DG CHEST 1V PORT
1 series · 1 of 1 positions shown · non-contrast
Comparison: Radiograph 04/22/2019, CT 04/22/2019

CLINICAL DATA: Hypoxia

EXAM:
PORTABLE CHEST 1 VIEW

[chest]
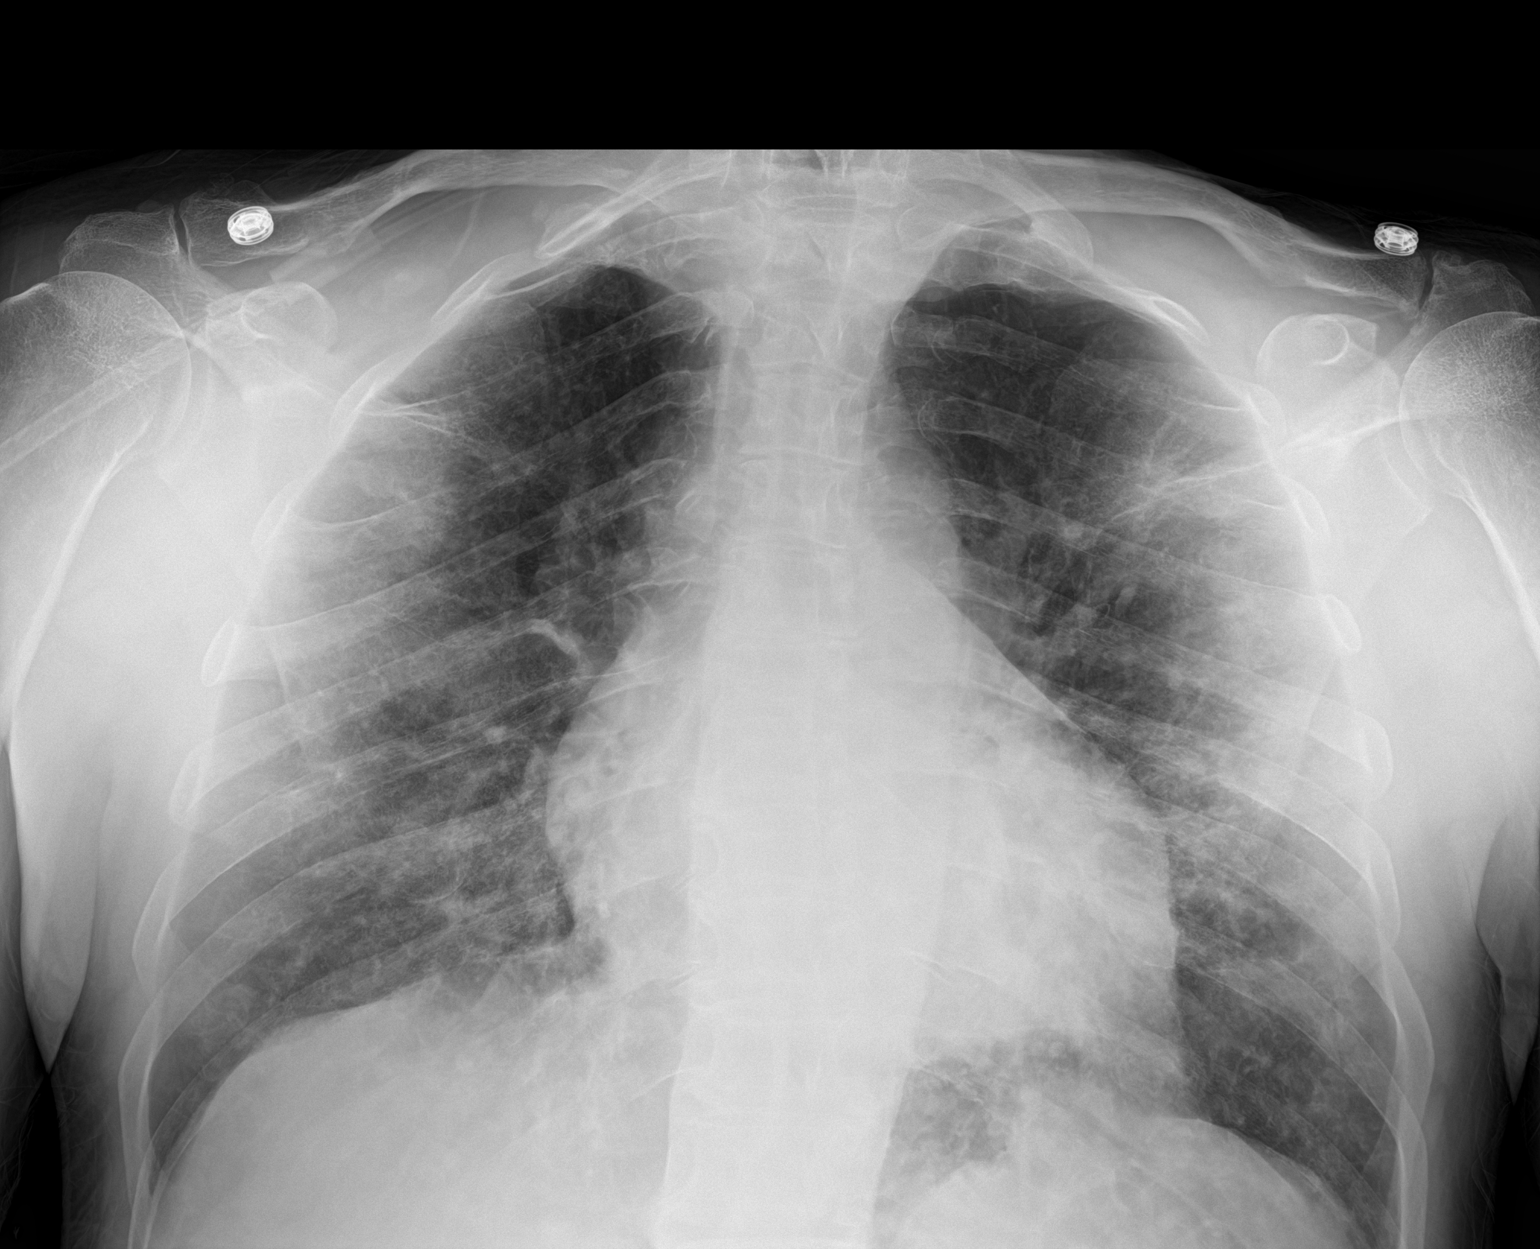

[1 of 1 positions shown; findings below may reference images not displayed]

FINDINGS: Stable cardiac silhouette. There is peripheral airspace disease
increased from comparison exam. Lung bases are clear. No pleural
fluid. No pneumothorax.
IMPRESSION: Peripheral bilateral airspace disease consistent COVID pneumonia.
Mild increase in density of airspace disease.

## 2021-08-30 DIAGNOSIS — Z0000 Encounter for general adult medical examination without abnormal findings: Secondary | ICD-10-CM | POA: Diagnosis not present

## 2021-08-30 DIAGNOSIS — N401 Enlarged prostate with lower urinary tract symptoms: Secondary | ICD-10-CM | POA: Diagnosis not present

## 2021-08-30 DIAGNOSIS — Z13220 Encounter for screening for lipoid disorders: Secondary | ICD-10-CM | POA: Diagnosis not present

## 2021-08-30 DIAGNOSIS — E041 Nontoxic single thyroid nodule: Secondary | ICD-10-CM | POA: Diagnosis not present

## 2021-08-30 DIAGNOSIS — Z125 Encounter for screening for malignant neoplasm of prostate: Secondary | ICD-10-CM | POA: Diagnosis not present

## 2021-08-30 DIAGNOSIS — M659 Synovitis and tenosynovitis, unspecified: Secondary | ICD-10-CM | POA: Diagnosis not present

## 2021-09-27 DIAGNOSIS — Z6825 Body mass index (BMI) 25.0-25.9, adult: Secondary | ICD-10-CM | POA: Diagnosis not present

## 2021-09-27 DIAGNOSIS — M659 Synovitis and tenosynovitis, unspecified: Secondary | ICD-10-CM | POA: Diagnosis not present

## 2021-10-13 DIAGNOSIS — Z01818 Encounter for other preprocedural examination: Secondary | ICD-10-CM | POA: Diagnosis not present

## 2021-11-23 DIAGNOSIS — K5730 Diverticulosis of large intestine without perforation or abscess without bleeding: Secondary | ICD-10-CM | POA: Diagnosis not present

## 2021-11-23 DIAGNOSIS — Z79899 Other long term (current) drug therapy: Secondary | ICD-10-CM | POA: Diagnosis not present

## 2021-11-23 DIAGNOSIS — Z7982 Long term (current) use of aspirin: Secondary | ICD-10-CM | POA: Diagnosis not present

## 2021-11-23 DIAGNOSIS — Z1211 Encounter for screening for malignant neoplasm of colon: Secondary | ICD-10-CM | POA: Diagnosis not present

## 2022-09-05 DIAGNOSIS — Z125 Encounter for screening for malignant neoplasm of prostate: Secondary | ICD-10-CM | POA: Diagnosis not present

## 2022-09-05 DIAGNOSIS — Z0000 Encounter for general adult medical examination without abnormal findings: Secondary | ICD-10-CM | POA: Diagnosis not present

## 2022-09-05 DIAGNOSIS — E041 Nontoxic single thyroid nodule: Secondary | ICD-10-CM | POA: Diagnosis not present

## 2022-09-05 DIAGNOSIS — N401 Enlarged prostate with lower urinary tract symptoms: Secondary | ICD-10-CM | POA: Diagnosis not present

## 2022-09-05 DIAGNOSIS — Z6826 Body mass index (BMI) 26.0-26.9, adult: Secondary | ICD-10-CM | POA: Diagnosis not present

## 2022-10-07 DIAGNOSIS — E041 Nontoxic single thyroid nodule: Secondary | ICD-10-CM | POA: Diagnosis not present
